# Patient Record
Sex: Female | Born: 1980 | Race: Black or African American | Marital: Single
Health system: Southern US, Community
[De-identification: ages and names within clinical notes are randomized; demographics above are authoritative.]

## PROBLEM LIST (undated history)

## (undated) DIAGNOSIS — Z9289 Personal history of other medical treatment: Secondary | ICD-10-CM

## (undated) DIAGNOSIS — Z8632 Personal history of gestational diabetes: Secondary | ICD-10-CM

## (undated) DIAGNOSIS — Z86018 Personal history of other benign neoplasm: Secondary | ICD-10-CM

## (undated) DIAGNOSIS — E119 Type 2 diabetes mellitus without complications: Secondary | ICD-10-CM

## (undated) DIAGNOSIS — G43909 Migraine, unspecified, not intractable, without status migrainosus: Secondary | ICD-10-CM

## (undated) DIAGNOSIS — I10 Essential (primary) hypertension: Secondary | ICD-10-CM

## (undated) DIAGNOSIS — K219 Gastro-esophageal reflux disease without esophagitis: Secondary | ICD-10-CM

## (undated) DIAGNOSIS — Z8739 Personal history of other diseases of the musculoskeletal system and connective tissue: Secondary | ICD-10-CM

## (undated) DIAGNOSIS — E7800 Pure hypercholesterolemia, unspecified: Secondary | ICD-10-CM

## (undated) DIAGNOSIS — D649 Anemia, unspecified: Secondary | ICD-10-CM

## (undated) DIAGNOSIS — J302 Other seasonal allergic rhinitis: Secondary | ICD-10-CM

## (undated) DIAGNOSIS — E079 Disorder of thyroid, unspecified: Secondary | ICD-10-CM

## (undated) DIAGNOSIS — M797 Fibromyalgia: Secondary | ICD-10-CM

## (undated) DIAGNOSIS — E039 Hypothyroidism, unspecified: Secondary | ICD-10-CM

## (undated) DIAGNOSIS — K589 Irritable bowel syndrome without diarrhea: Secondary | ICD-10-CM

## (undated) DIAGNOSIS — E559 Vitamin D deficiency, unspecified: Secondary | ICD-10-CM

## (undated) DIAGNOSIS — G43009 Migraine without aura, not intractable, without status migrainosus: Secondary | ICD-10-CM

## (undated) DIAGNOSIS — O1490 Unspecified pre-eclampsia, unspecified trimester: Secondary | ICD-10-CM

## (undated) DIAGNOSIS — Z309 Encounter for contraceptive management, unspecified: Secondary | ICD-10-CM

## (undated) DIAGNOSIS — K279 Peptic ulcer, site unspecified, unspecified as acute or chronic, without hemorrhage or perforation: Secondary | ICD-10-CM

## (undated) HISTORY — DX: Personal history of other medical treatment: Z92.89

## (undated) HISTORY — DX: Personal history of gestational diabetes: Z86.32

## (undated) HISTORY — DX: Personal history of other diseases of the musculoskeletal system and connective tissue: Z87.39

## (undated) HISTORY — DX: Other seasonal allergic rhinitis: J30.2

## (undated) HISTORY — PX: UTERINE FIBROID EMBOLIZATION: SHX825

## (undated) HISTORY — DX: Unspecified pre-eclampsia, unspecified trimester: O14.90

## (undated) HISTORY — PX: BREAST SURGERY: SHX581

## (undated) HISTORY — PX: ROBOTIC ASSISTED LAPAROSCOPIC VAGINAL HYSTERECTOMY WITH FIBROID REMOVAL: SHX5387

## (undated) HISTORY — PX: HERNIA REPAIR: SHX51

## (undated) HISTORY — DX: Migraine without aura, not intractable, without status migrainosus: G43.009

## (undated) HISTORY — DX: Essential (primary) hypertension: I10

## (undated) HISTORY — DX: Irritable bowel syndrome, unspecified: K58.9

## (undated) HISTORY — DX: Encounter for contraceptive management, unspecified: Z30.9

## (undated) HISTORY — PX: ABDOMINAL SURGERY: SHX537

## (undated) HISTORY — DX: Gastro-esophageal reflux disease without esophagitis: K21.9

## (undated) HISTORY — DX: Personal history of other benign neoplasm: Z86.018

## (undated) HISTORY — PX: BREAST REDUCTION SURGERY: SHX8

## (undated) HISTORY — DX: Peptic ulcer, site unspecified, unspecified as acute or chronic, without hemorrhage or perforation: K27.9

## (undated) HISTORY — DX: Type 2 diabetes mellitus without complications: E11.9

## (undated) HISTORY — PX: REPAIR OF PERFORATED ULCER: SHX6065

## (undated) HISTORY — PX: THYROIDECTOMY: SHX17

## (undated) HISTORY — DX: Hypothyroidism, unspecified: E03.9

## (undated) HISTORY — DX: Vitamin D deficiency, unspecified: E55.9

---

## 2014-06-05 LAB — HEMOGLOBIN A1C: Hgb A1c MFr Bld: 5.5 % (ref 4.0–6.0)

## 2014-06-05 LAB — TSH: TSH: 0.03 u[IU]/mL — AB (ref 0.41–5.90)

## 2014-12-10 LAB — BASIC METABOLIC PANEL
BUN: 8 mg/dL (ref 4–21)
Glucose: 1 mg/dL
POTASSIUM: 3.6 mmol/L (ref 3.4–5.3)
SODIUM: 142 mmol/L (ref 137–147)

## 2014-12-10 LAB — HEPATIC FUNCTION PANEL
ALT: 12 U/L (ref 7–35)
AST: 12 U/L — AB (ref 13–35)
Alkaline Phosphatase: 68 U/L (ref 25–125)
Bilirubin, Total: 0.3 mg/dL

## 2014-12-10 LAB — CBC AND DIFFERENTIAL
HEMATOCRIT: 34 % — AB (ref 36–46)
Hemoglobin: 11 g/dL — AB (ref 12.0–16.0)
Platelets: 324 10*3/uL (ref 150–399)
WBC: 5.1 10^3/mL

## 2014-12-11 LAB — LIPID PANEL
CHOLESTEROL: 118 mg/dL (ref 0–200)
HDL: 44 mg/dL (ref 35–70)
LDL Cholesterol: 65 mg/dL
TRIGLYCERIDES: 44 mg/dL (ref 40–160)

## 2014-12-11 LAB — TSH: TSH: 1.76 u[IU]/mL (ref 0.41–5.90)

## 2014-12-30 ENCOUNTER — Ambulatory Visit (INDEPENDENT_AMBULATORY_CARE_PROVIDER_SITE_OTHER): Payer: 59 | Admitting: Osteopathic Medicine

## 2014-12-30 ENCOUNTER — Encounter: Payer: Self-pay | Admitting: Osteopathic Medicine

## 2014-12-30 VITALS — BP 133/94 | HR 85 | Ht 63.0 in

## 2014-12-30 DIAGNOSIS — K219 Gastro-esophageal reflux disease without esophagitis: Secondary | ICD-10-CM | POA: Diagnosis not present

## 2014-12-30 DIAGNOSIS — G43009 Migraine without aura, not intractable, without status migrainosus: Secondary | ICD-10-CM

## 2014-12-30 DIAGNOSIS — E559 Vitamin D deficiency, unspecified: Secondary | ICD-10-CM | POA: Insufficient documentation

## 2014-12-30 DIAGNOSIS — O1490 Unspecified pre-eclampsia, unspecified trimester: Secondary | ICD-10-CM

## 2014-12-30 DIAGNOSIS — E039 Hypothyroidism, unspecified: Secondary | ICD-10-CM | POA: Diagnosis not present

## 2014-12-30 DIAGNOSIS — K589 Irritable bowel syndrome without diarrhea: Secondary | ICD-10-CM | POA: Insufficient documentation

## 2014-12-30 DIAGNOSIS — Z8632 Personal history of gestational diabetes: Secondary | ICD-10-CM

## 2014-12-30 DIAGNOSIS — R002 Palpitations: Secondary | ICD-10-CM

## 2014-12-30 DIAGNOSIS — Z86018 Personal history of other benign neoplasm: Secondary | ICD-10-CM

## 2014-12-30 DIAGNOSIS — I10 Essential (primary) hypertension: Secondary | ICD-10-CM

## 2014-12-30 DIAGNOSIS — J302 Other seasonal allergic rhinitis: Secondary | ICD-10-CM

## 2014-12-30 DIAGNOSIS — Z8739 Personal history of other diseases of the musculoskeletal system and connective tissue: Secondary | ICD-10-CM | POA: Insufficient documentation

## 2014-12-30 DIAGNOSIS — G894 Chronic pain syndrome: Secondary | ICD-10-CM

## 2014-12-30 DIAGNOSIS — K30 Functional dyspepsia: Secondary | ICD-10-CM | POA: Insufficient documentation

## 2014-12-30 DIAGNOSIS — Z309 Encounter for contraceptive management, unspecified: Secondary | ICD-10-CM

## 2014-12-30 DIAGNOSIS — Z9889 Other specified postprocedural states: Secondary | ICD-10-CM | POA: Insufficient documentation

## 2014-12-30 HISTORY — DX: Unspecified pre-eclampsia, unspecified trimester: O14.90

## 2014-12-30 HISTORY — DX: Hypothyroidism, unspecified: E03.9

## 2014-12-30 HISTORY — DX: Migraine without aura, not intractable, without status migrainosus: G43.009

## 2014-12-30 HISTORY — DX: Gastro-esophageal reflux disease without esophagitis: K21.9

## 2014-12-30 HISTORY — DX: Personal history of other diseases of the musculoskeletal system and connective tissue: Z87.39

## 2014-12-30 HISTORY — DX: Essential (primary) hypertension: I10

## 2014-12-30 HISTORY — DX: Personal history of other benign neoplasm: Z86.018

## 2014-12-30 HISTORY — DX: Other seasonal allergic rhinitis: J30.2

## 2014-12-30 HISTORY — DX: Encounter for contraceptive management, unspecified: Z30.9

## 2014-12-30 HISTORY — DX: Vitamin D deficiency, unspecified: E55.9

## 2014-12-30 HISTORY — DX: Personal history of gestational diabetes: Z86.32

## 2014-12-30 MED ORDER — AMITRIPTYLINE HCL 50 MG PO TABS: 50.0000 mg | ORAL_TABLET | Freq: Every day | ORAL | Status: DC

## 2014-12-30 MED ORDER — DOCUSATE SODIUM 50 MG PO CAPS: 50.0000 mg | ORAL_CAPSULE | Freq: Two times a day (BID) | ORAL | Status: AC

## 2014-12-30 MED ORDER — DULOXETINE HCL 30 MG PO CPEP: 30.0000 mg | ORAL_CAPSULE | Freq: Every day | ORAL | Status: DC

## 2014-12-30 NOTE — Progress Notes (Signed)
HPI: Hannah Taylor is a 34 y.o. female who presents to [facility]  today for chief complaint of:  Chief Complaint  Patient presents with  . Establish Care    thyroid, GI, HTN issues   Last seen by doctor about 06/2014. Most concerned today about establishing primary doctor for multiple medical issues.   GI - seen there for IBS, constipation predominant. Colonoscopy done 06/2014 was normal, (+) bright red bloody streaks with constipation BM  ENDOCRINE - hypothyroid concerns, seen recently by Endocrinologist, last TSH normal 11/2014, same dose Thyroid Rx for a year, Hx gestational Dm on no meds  NEURO/Pain - on Duloxetine, isn't really helping anymore. Makes her tired. Been on Zoloft, Baclofen, Naproxen   CARDIAC - Feels SOB sometimes, Propranolol 60 mg daily for palpitations and Lisinopril for blood pressure problems. Has been seen by Cardiologist (can't remmeber name) within the past year, had EKG done but no stress test, no Echo, no Holter   Other medical hx reviewed below.    Past medical, social and family history reviewed: Past Medical History  Diagnosis Date  . Essential hypertension 12/30/2014  . Hypothyroidism 12/30/2014  . Stomach upset 12/30/2014  . History of low back pain 12/30/2014  . Migraine without aura 12/30/2014  . Seasonal allergies 12/30/2014  . Vitamin D deficiency 12/30/2014  . GERD (gastroesophageal reflux disease) 12/30/2014    On Prilosec 20 ER  . History of bilateral breast reduction surgery 12/30/2014  . Contraception management 12/30/2014    Depo Provera  . History of uterine fibroid 12/30/2014    S/p removal  . History of gestational diabetes 12/30/2014  . Preeclampsia 12/30/2014    History of    No past surgical history on file. Social History  Substance Use Topics  . Smoking status: Not on file  . Smokeless tobacco: Not on file  . Alcohol Use: Not on file   No family history on file.  Current Outpatient  Prescriptions  Medication Sig Dispense Refill  . DULoxetine (CYMBALTA) 60 MG capsule Take 60 mg by mouth daily.    Marland Kitchen levothyroxine (SYNTHROID, LEVOTHROID) 125 MCG tablet Take 125 mcg by mouth daily before breakfast.    . lisinopril-hydrochlorothiazide (PRINZIDE,ZESTORETIC) 20-25 MG tablet Take 1 tablet by mouth daily.    . naproxen (NAPROSYN) 500 MG tablet Take 500 mg by mouth as needed.    . ondansetron (ZOFRAN) 4 MG tablet Take 4 mg by mouth every 8 (eight) hours as needed for nausea or vomiting.    . propranolol (INDERAL) 60 MG tablet Take 60 mg by mouth 3 (three) times daily.     No current facility-administered medications for this visit.   Allergies  Allergen Reactions  . Aspirin Other (See Comments)    Nose bleeds  . Iodine Swelling      Review of Systems: CONSTITUTIONAL:  Yes  fever, no chills, Yes  unintentional weight changes HEAD/EYES/EARS/NOSE/THROAT: No headache, no vision change, no hearing change, No  sore throat CARDIAC: (+) chest pain, (+) pressure/palpitations, no orthopnea RESPIRATORY: Yes  cough, Yes  shortness of breath/wheeze GASTROINTESTINAL: (+) nausea, no vomiting, no abdominal pain, (+) blood in stool bright red streaks, no black/tarry stool,  no diarrhea, (+) constipation MUSCULOSKELETAL: Yes  myalgia/arthralgia GENITOURINARY: No incontinence, No abnormal genital bleeding/discharge SKIN: No rash/wounds/concerning lesions HEM/ONC: No easy bruising/bleeding, no abnormal lymph node ENDOCRINE: No polyuria/polydipsia/polyphagia, no heat/cold intolerance  NEUROLOGIC: No weakness, no dizziness, no slurred speech, (+) headache, (+) memory problem  PSYCHIATRIC: (+) concerns  with depression, (+) concerns with anxiety, (+) sleep problems "due to physical issues" PHQ2(+)   Exam:  BP 133/94 mmHg  Pulse 85  Ht  (1.6 m) Constitutional: VSS, see above. General Appearance: alert, well-developed, well-nourished, NAD Eyes: Normal lids and conjunctive, non-icteric  sclera, PERRLA Ears, Nose, Mouth, Throat: Normal external inspection ears/nares/mouth/lips/gums, TM normal bilaterally, MMM, posterior pharynx No  erythema No  exudate Neck: No masses, trachea midline. No thyroid enlargement/tenderness/mass appreciated. No lymphadenopathy Respiratory: Normal respiratory effort. no wheeze, no rhonchi, no rales Cardiovascular: S1/S2 normal, no murmur, no rub/gallop auscultated. RRR.  No carotid bruit or JVD. No abdominal aortic bruit.  Pedal pulse II/IV bilaterally DP and PT.  No lower extremity edema. Gastrointestinal: Nontender, no masses. No hepatomegaly, no splenomegaly. No hernia appreciated. Bowel sounds normal. Rectal exam deferred.  Musculoskeletal: Gait normal. No clubbing/cyanosis of digits.  Neurological: No cranial nerve deficit on limited exam. Motor and sensation intact and symmetric Psychiatric: Normal judgment/insight. Normal mood and affect. Oriented x3.    No results found for this or any previous visit (from the past 72 hour(s)).   EKG interpretation: Rate: 75  Rhythm: sinus No ST/T changes concerning for acute ischemia/infarct  Invert/flat T III, V1, V3, V4     ASSESSMENT/PLAN:  Essential hypertension , controlled on current medications, continue these.  Hypothyroidism, unspecified hypothyroidism type most recent TSH normal, continue current dose levothyroxine  Gastroesophageal reflux disease, esophagitis presence not specified avoid NSAID such as naproxen, may consider restart ranitidine, patient not complaining of GERD symptoms today, await records,   History of gestational diabetes- most recent A1c normal   Preeclampsia, unspecified trimester - history of  Palpitations - relatively controlled on Propranolol but reports some increased SOB lately, referral to cardiology placed, pt has no hx w/u other than EKG - Plan: EKG 12-Lead, Ambulatory referral to Cardiology  Chronic pain disorder - taper of dulxetine, strat  amitriptyline, see pt instructions - Plan: DULoxetine (CYMBALTA) 30 MG capsule, amitriptyline (ELAVIL) 50 MG tablet  Irritable bowel syndrome (IBS) - Plan: docusate sodium (COLACE) 50 MG capsule   Below was printed for and reviewed with the pt.    CHRONIC PAIN: 1. TAPER OFF DULOXETINE: STOP 60 MG CAPSULES. WILL TAKE 30 MG CAPSULES X 2 WEEKS THEN STOP ALTOGETHER 2. WILL START AMITRIPTYLINE: TAKE 25 MG TABLETS (HALF OF 50 MG TABLET) AT NIGHT X 2 WEEKS, THEN INCREASE TO 50 MG (WHOLE TABLET) AT NIGHT  HEART: WE ARE REFERRING TO CARDIOLOGY TO ENSURE NO ABNORMALITY WHICH MAY BE CAUSING YOUR PALPITATIONS AND BREATHING TROUBLES. THEY MAY DECIDE TO DO FURTHER TESTING. ANY CONCERNING CHEST PAIN OR DIFFICULTY BREATHING, YOU NEED TO GO TO THE EMERGENCY ROOM!   STOMACH/GI ISSUES: CONTINUE ZOFRAN FOR NAUSEA AS NEEDED. AMITRIPTYLINE MAY HELP SYMPTOMS. WE ARE ALSO STARTING COLACE STOOL SOFTENER WHICH SHOULD HELP PREVENT CONSTIPATION. WE WILL TALK MORE ABOUT THIS DIAGNOSIS AND TREATMENT OPTIONS AT NEXT VISIT.    Patient has been educated on significant possible side effects of medication and is instructed to contact me or other medical professional with any concerns about side effects.  ER precautions reviewed.  RTC sooner if any concerns.   Return in about 4 weeks (around 01/27/2015), or sooner if symptoms worsen or fail to improve, for GI AND HEART ISSUES., Will also follow up on psych concerns

## 2014-12-30 NOTE — Patient Instructions (Addendum)
CHRONIC PAIN: 1. TAPER OFF DULOXETINE: STOP 60 MG CAPSULES. WILL TAKE 30 MG CAPSULES X 2 WEEKS THEN STOP ALTOGETHER 2. WILL START AMITRIPTYLINE: TAKE 25 MG TABLETS (HALF OF 50 MG TABLET) AT NIGHT X 2 WEEKS, THEN INCREASE TO 50 MG (WHOLE TABLET) AT NIGHT  HEART: WE ARE REFERRING TO CARDIOLOGY TO ENSURE NO ABNORMALITY WHICH MAY BE CAUSING YOUR PALPITATIONS AND BREATHING TROUBLES. THEY MAY DECIDE TO DO FURTHER TESTING. ANY CONCERNING CHEST PAIN OR DIFFICULTY BREATHING, YOU NEED TO GO TO THE EMERGENCY ROOM!   STOMACH/GI ISSUES: CONTINUE ZOFRAN FOR NAUSEA AS NEEDED. AMITRIPTYLINE MAY HELP SYMPTOMS. WE ARE ALSO STARTING COLACE STOOL SOFTENER WHICH SHOULD HELP PREVENT CONSTIPATION. WE WILL TALK MORE ABOUT THIS DIAGNOSIS AND TREATMENT OPTIONS AT NEXT VISIT.

## 2015-01-01 ENCOUNTER — Encounter: Payer: Self-pay | Admitting: Emergency Medicine

## 2015-01-01 LAB — CHG IRON BINDING TEST
Albumin, Serum: 4.2
CHLORIDE: 103 mmol/L
GFR CALC AF AMER: 113
IRON: 78
MCV: 92
TIBC: 294

## 2015-01-01 LAB — VITAMIN D 1,25 DIHYDROXY
Amylase, Serum: 53
LIPASE: 31
VIT D 25 HYDROXY: 27.6

## 2015-01-01 LAB — T4: T4,Free (Direct): 1.62

## 2015-01-07 ENCOUNTER — Encounter: Payer: Self-pay | Admitting: Osteopathic Medicine

## 2015-01-07 DIAGNOSIS — Z8739 Personal history of other diseases of the musculoskeletal system and connective tissue: Secondary | ICD-10-CM | POA: Insufficient documentation

## 2015-01-07 DIAGNOSIS — Z87898 Personal history of other specified conditions: Secondary | ICD-10-CM | POA: Insufficient documentation

## 2015-01-07 DIAGNOSIS — Z9289 Personal history of other medical treatment: Secondary | ICD-10-CM

## 2015-01-07 HISTORY — DX: Personal history of other medical treatment: Z92.89

## 2015-01-29 ENCOUNTER — Ambulatory Visit (INDEPENDENT_AMBULATORY_CARE_PROVIDER_SITE_OTHER): Payer: 59 | Admitting: Osteopathic Medicine

## 2015-01-29 DIAGNOSIS — I10 Essential (primary) hypertension: Secondary | ICD-10-CM

## 2015-01-29 NOTE — Progress Notes (Signed)
No show

## 2015-02-02 NOTE — Progress Notes (Signed)
HPI: 34 year old female for evaluation of dyspnea. TSH and potassium normal October 2016. Patient complains of dyspnea for 6 months to one year. It occurs both with exertion and at rest. No orthopnea, PND. Occasional minimal pedal edema. She occasionally feels episodes of tightness extremities her left or right upper extremity associated with chest tightness. It can last an hour at a time. Not pleuritic or positional and not exertional. She does not have exertional chest pain. She does have occasional palpitations but they have improved with the addition of propranolol.  Current Outpatient Prescriptions  Medication Sig Dispense Refill  . amitriptyline (ELAVIL) 50 MG tablet Take 1 tablet (50 mg total) by mouth at bedtime. 30 tablet 1  . docusate sodium (COLACE) 50 MG capsule Take 1 capsule (50 mg total) by mouth 2 (two) times daily. 10 capsule 0  . DULoxetine (CYMBALTA) 30 MG capsule Take 1 capsule (30 mg total) by mouth daily. 14 capsule 0  . levothyroxine (SYNTHROID, LEVOTHROID) 125 MCG tablet Take 125 mcg by mouth daily before breakfast.    . lisinopril-hydrochlorothiazide (PRINZIDE,ZESTORETIC) 20-25 MG tablet Take 1 tablet by mouth daily.    . medroxyPROGESTERone (DEPO-PROVERA) 150 MG/ML injection Inject 150 mg into the muscle every 3 (three) months.    . naproxen (NAPROSYN) 500 MG tablet Take 500 mg by mouth as needed.    . ondansetron (ZOFRAN) 4 MG tablet Take 4 mg by mouth every 8 (eight) hours as needed for nausea or vomiting.    . propranolol (INDERAL) 60 MG tablet Take 60 mg by mouth 3 (three) times daily.     No current facility-administered medications for this visit.    Allergies  Allergen Reactions  . Aspirin Other (See Comments)    Nose bleeds  . Iodinated Diagnostic Agents Swelling  . Iodine Swelling     Past Medical History  Diagnosis Date  . Essential hypertension 12/30/2014  . Hypothyroidism 12/30/2014  . History of low back pain 12/30/2014  . Migraine without  aura 12/30/2014  . Seasonal allergies 12/30/2014  . Vitamin D deficiency 12/30/2014  . GERD (gastroesophageal reflux disease) 12/30/2014    On Prilosec 20 ER  . Contraception management 12/30/2014    Depo Provera  . History of uterine fibroid 12/30/2014    S/p removal  . History of gestational diabetes 12/30/2014  . Preeclampsia 12/30/2014    History of   . Irritable bowel syndrome (IBS)   . H/O mammogram 01/07/2015    Normal 09/18/10  . PUD (peptic ulcer disease)     Past Surgical History  Procedure Laterality Date  . Breast reduction surgery    . Hernia repair    . Repair of perforated ulcer    . Cesarean section    . Robotic assisted laparoscopic vaginal hysterectomy with fibroid removal      Social History   Social History  . Marital Status: Single    Spouse Name: N/A  . Number of Children: 1  . Years of Education: N/A   Occupational History  . Not on file.   Social History Main Topics  . Smoking status: Never Smoker   . Smokeless tobacco: Not on file  . Alcohol Use: 0.0 oz/week    0 Standard drinks or equivalent per week     Comment: Occasional  . Drug Use: Not on file  . Sexual Activity: Not on file   Other Topics Concern  . Not on file   Social History Narrative    Family History  Problem Relation Age of Onset  . Diabetes Mother   . Hypertension Mother   . Hyperlipidemia Father   . Hypertension Father   . Diabetes Maternal Grandmother   . Kidney failure Maternal Grandmother     ROS: Fatigue and weight gain but no fevers or chills, productive cough, hemoptysis, dysphasia, odynophagia, melena, hematochezia, dysuria, hematuria, rash, seizure activity, orthopnea, PND, pedal edema, claudication. Remaining systems are negative.  Physical Exam:   Blood pressure 134/90, pulse 97, height 5\' 3"  (1.6 m), weight 183 lb 6.4 oz (83.19 kg), SpO2 98 %.  General:  Well developed/well nourished in NAD Skin warm/dry Patient not depressed No peripheral  clubbing Back-normal HEENT-normal/normal eyelids Neck supple/normal carotid upstroke bilaterally; no bruits; no JVD; no thyromegaly chest - CTA/ normal expansion CV - RRR/normal S1 and S2; no murmurs, rubs or gallops;  PMI nondisplaced Abdomen -NT/ND, no HSM, no mass, + bowel sounds, no bruit 2+ femoral pulses, no bruits Ext-no edema, chords, 2+ DP Neuro-grossly nonfocal  ECG 12/30/2014-sinus rhythm with Nonspecific ST-T changes.

## 2015-02-04 ENCOUNTER — Encounter: Payer: Self-pay | Admitting: Cardiology

## 2015-02-04 ENCOUNTER — Ambulatory Visit (INDEPENDENT_AMBULATORY_CARE_PROVIDER_SITE_OTHER): Payer: 59 | Admitting: Cardiology

## 2015-02-04 VITALS — BP 134/90 | HR 97 | Ht 63.0 in | Wt 183.4 lb

## 2015-02-04 DIAGNOSIS — R0600 Dyspnea, unspecified: Secondary | ICD-10-CM | POA: Insufficient documentation

## 2015-02-04 DIAGNOSIS — R002 Palpitations: Secondary | ICD-10-CM | POA: Diagnosis not present

## 2015-02-04 DIAGNOSIS — Z87898 Personal history of other specified conditions: Secondary | ICD-10-CM | POA: Diagnosis not present

## 2015-02-04 DIAGNOSIS — I10 Essential (primary) hypertension: Secondary | ICD-10-CM | POA: Diagnosis not present

## 2015-02-04 NOTE — Assessment & Plan Note (Signed)
Patient symptoms do not sound to be cardiac. We will schedule an echocardiogram to assess LV function and wall motion.

## 2015-02-04 NOTE — Assessment & Plan Note (Signed)
Etiology unclear.We will arrange an echocardiogram to assess LV function. If normal I do not think we need to pursue further cardiac evaluation.

## 2015-02-04 NOTE — Assessment & Plan Note (Addendum)
We will advance propranolol once we know whether she is taking long-acting or regular.

## 2015-02-04 NOTE — Assessment & Plan Note (Signed)
Blood pressure is borderline. She is not clear if she is taking propranolol LA or propranolol regular formulation. She will contact us and we will increase this medication both for history of palpitations and blood pressure.

## 2015-02-04 NOTE — Patient Instructions (Signed)
Medication Instructions:  Please continue your current medications.  Please call tomorrow to speak with a nurse regarding your Propranolol (whether it is short or long acting)  Labwork: NONE  Testing/Procedures: 1. 2D Echocardiogram - Your physician has requested that you have an echocardiogram. Echocardiography is a painless test that uses sound waves to create images of your heart. It provides your doctor with information about the size and shape of your heart and how well your heart's chambers and valves are working. This procedure takes approximately one hour. There are no restrictions for this procedure.  Follow-Up: Dr Jens Som recommends that you schedule a follow-up appointment in 6 months. You will receive a reminder letter in the mail two months in advance. If you don't receive a letter, please call our office to schedule the follow-up appointment.  If you need a refill on your cardiac medications before your next appointment, please call your pharmacy.

## 2015-02-05 ENCOUNTER — Encounter: Payer: Self-pay | Admitting: Osteopathic Medicine

## 2015-02-05 ENCOUNTER — Telehealth: Payer: Self-pay | Admitting: Cardiology

## 2015-02-05 ENCOUNTER — Ambulatory Visit (INDEPENDENT_AMBULATORY_CARE_PROVIDER_SITE_OTHER): Payer: 59 | Admitting: Osteopathic Medicine

## 2015-02-05 VITALS — BP 128/76 | HR 94 | Ht 63.0 in | Wt 182.0 lb

## 2015-02-05 DIAGNOSIS — G894 Chronic pain syndrome: Secondary | ICD-10-CM

## 2015-02-05 DIAGNOSIS — L509 Urticaria, unspecified: Secondary | ICD-10-CM | POA: Insufficient documentation

## 2015-02-05 DIAGNOSIS — R110 Nausea: Secondary | ICD-10-CM | POA: Diagnosis not present

## 2015-02-05 DIAGNOSIS — R52 Pain, unspecified: Secondary | ICD-10-CM | POA: Diagnosis not present

## 2015-02-05 LAB — POCT INFLUENZA A/B
INFLUENZA B, POC: NEGATIVE
Influenza A, POC: NEGATIVE

## 2015-02-05 MED ORDER — PREGABALIN 75 MG PO CAPS: 75.0000 mg | ORAL_CAPSULE | Freq: Two times a day (BID) | ORAL | Status: DC

## 2015-02-05 NOTE — Telephone Encounter (Signed)
Calling to speak w/ a Nurse about her medications . Please call   Thanks

## 2015-02-05 NOTE — Progress Notes (Signed)
HPI: Hannah Taylor is a 34 y.o. female who presents to [facility] today for chief complaint of:  Chief Complaint  Patient presents with  . Allergic Reaction    AMITRIPTYLINE   . Location: on chest, in fold of arms  . Quality: itching . Severity: mild  . Context: instructions given at last visit 12/30/14 to taper Cymbalta and start Amitriptyline. She started the Amitriptyline about 2 weeks after last visit. Started itching soon after that, last night the itching was pretty bad and developed a rash, took benadryl. Other than the itching, it has been helping chronic pain and IBS symptoms.  . Modifying factors: Amitriptyline has been helping w/ pain and IBS symptoms.  . Assoc signs/symptoms: Pt also complaining of nausea, subjective fever, chills, loose stool x 2 and wants checked for the flu. No rash now.    Past medical, social and family history reviewed: Past Medical History  Diagnosis Date  . Essential hypertension 12/30/2014  . Hypothyroidism 12/30/2014  . History of low back pain 12/30/2014  . Migraine without aura 12/30/2014  . Seasonal allergies 12/30/2014  . Vitamin D deficiency 12/30/2014  . GERD (gastroesophageal reflux disease) 12/30/2014    On Prilosec 20 ER  . Contraception management 12/30/2014    Depo Provera  . History of uterine fibroid 12/30/2014    S/p removal  . History of gestational diabetes 12/30/2014  . Preeclampsia 12/30/2014    History of   . Irritable bowel syndrome (IBS)   . H/O mammogram 01/07/2015    Normal 09/18/10  . PUD (peptic ulcer disease)    Past Surgical History  Procedure Laterality Date  . Breast reduction surgery    . Hernia repair    . Repair of perforated ulcer    . Cesarean section    . Robotic assisted laparoscopic vaginal hysterectomy with fibroid removal     Social History  Substance Use Topics  . Smoking status: Never Smoker   . Smokeless tobacco: Not on file  . Alcohol Use: 0.0 oz/week    0  Standard drinks or equivalent per week     Comment: Occasional   Family History  Problem Relation Age of Onset  . Diabetes Mother   . Hypertension Mother   . Hyperlipidemia Father   . Hypertension Father   . Diabetes Maternal Grandmother   . Kidney failure Maternal Grandmother     Current Outpatient Prescriptions  Medication Sig Dispense Refill  . amitriptyline (ELAVIL) 50 MG tablet Take 1 tablet (50 mg total) by mouth at bedtime. 30 tablet 1  . docusate sodium (COLACE) 50 MG capsule Take 1 capsule (50 mg total) by mouth 2 (two) times daily. 10 capsule 0  . levothyroxine (SYNTHROID, LEVOTHROID) 125 MCG tablet Take 125 mcg by mouth daily before breakfast.    . lisinopril-hydrochlorothiazide (PRINZIDE,ZESTORETIC) 20-25 MG tablet Take 1 tablet by mouth daily.    . medroxyPROGESTERone (DEPO-PROVERA) 150 MG/ML injection Inject 150 mg into the muscle every 3 (three) months.    . naproxen (NAPROSYN) 500 MG tablet Take 500 mg by mouth as needed.    . ondansetron (ZOFRAN) 4 MG tablet Take 4 mg by mouth every 8 (eight) hours as needed for nausea or vomiting.    . propranolol (INDERAL) 60 MG tablet Take 60 mg by mouth 3 (three) times daily.    . DULoxetine (CYMBALTA) 30 MG capsule Take 1 capsule (30 mg total) by mouth daily. (Patient not taking: Reported on 02/05/2015) 14 capsule 0  No current facility-administered medications for this visit.   Allergies  Allergen Reactions  . Aspirin Other (See Comments)    Nose bleeds  . Iodinated Diagnostic Agents Swelling  . Iodine Swelling      Review of Systems: CONSTITUTIONAL:  Subjective  fever, no chills, No  unintentional weight changes HEAD/EYES/EARS/NOSE/THROAT: No  headache, no vision change, no hearing change, No  sore throat, No  sinus pressure CARDIAC: No  chest pain, No  pressure, No palpitations, No  orthopnea RESPIRATORY: No  cough, No  shortness of breath/wheeze GASTROINTESTINAL: Yes  nausea, No  vomiting, No  abdominal pain, No   blood in stool, Occasional  diarrhea, No  constipation  MUSCULOSKELETAL: Yes  myalgia/arthralgia SKIN: No  rash/wounds/concerning lesions now but (+) ithcing as per HPI HEM/ONC: No  easy bruising/bleeding, No  abnormal lymph node PSYCHIATRIC: No  concerns with depression, No  concerns with anxiety, No sleep problems     Exam:  BP 128/76 mmHg  Pulse 94  Ht 5\' 3"  (1.6 m)  Wt 182 lb (82.555 kg)  BMI 32.25 kg/m2 Constitutional: VS see above. General Appearance: alert, well-developed, well-nourished, NAD Eyes: Normal lids and conjunctive, non-icteric sclera, Respiratory: Normal respiratory effort. no wheeze, no rhonchi, no rales Cardiovascular: S1/S2 normal, no murmur, no rub/gallop auscultated. RRR.  Musculoskeletal: Gait normal. No clubbing/cyanosis of digits.  Neurological: No cranial nerve deficit on limited exam. Motor and sensation intact and symmetric Skin: warm, dry, intact. No rash/ulcer. No concerning nevi or subq nodules on limited exam.   Psychiatric: Normal judgment/insight. Slightly flat mood and affect. Oriented x3.    Results for orders placed or performed in visit on 02/05/15 (from the past 24 hour(s))  POCT Influenza A/B     Status: None   Collection Time: 02/05/15  2:09 PM  Result Value Ref Range   Influenza A, POC Negative Negative   Influenza B, POC Negative Negative     ASSESSMENT/PLAN:  Urticaria - question drug reaction Amitriptyline, cont Benadryl prn and follow up in office if no better - Plan: POCT Influenza A/B, skin exam normal today  Chronic pain syndrome - Has been on Cymbalta, Zoloft, Amitryptiline without benefit - Plan: pregabalin (LYRICA) 75 MG capsule, pt advised may need to go through prior auth process, will consider Gabapentin if unable to get Lyrica covered. Pt denies depression problems or previous treatment for depression but worth further exploring this at next visit  Nausea without vomiting - flu neg  Body aches - flu neg  Some  consideration for viral exanthem rather than drug reaction, will try off Amitriptyline and see if improves   Return in about 6 months (around 08/06/2015), or sooner if symptoms worsen or fail to improve, for followup blood pressure, annual physical.

## 2015-02-05 NOTE — Patient Instructions (Addendum)
Stop Amitriptyline. Will order Lyrica, this may take a few days for insurance to approve. If insurance doesn't approve this will try alternative medication. Continue Benadryl to help with itching.

## 2015-02-05 NOTE — Telephone Encounter (Signed)
Left message for patient to return call.

## 2015-02-09 NOTE — Telephone Encounter (Signed)
Left 2nd message for patient to call

## 2015-02-12 ENCOUNTER — Telehealth: Payer: Self-pay | Admitting: Osteopathic Medicine

## 2015-02-12 NOTE — Telephone Encounter (Signed)
Received fax for prior authorization on Lyrica sent through cover my meds waiting on authorization. - CF °

## 2015-02-19 ENCOUNTER — Ambulatory Visit (HOSPITAL_COMMUNITY): Payer: 59 | Attending: Cardiovascular Disease

## 2015-02-20 ENCOUNTER — Encounter (HOSPITAL_COMMUNITY): Payer: Self-pay | Admitting: *Deleted

## 2015-02-24 ENCOUNTER — Telehealth: Payer: Self-pay

## 2015-02-24 MED ORDER — MEDROXYPROGESTERONE ACETATE 150 MG/ML IM SUSP: 150.0000 mg | INTRAMUSCULAR | Status: DC

## 2015-02-24 NOTE — Telephone Encounter (Signed)
Sent!

## 2015-02-24 NOTE — Telephone Encounter (Signed)
Patient needs a refill on Depo-provera sent to [facility]. Historical medication.

## 2015-02-25 NOTE — Telephone Encounter (Signed)
PATIENT HAS BEEN INFORMED. Jarin Cornfield,CMA  

## 2015-03-03 NOTE — Telephone Encounter (Signed)
Received fax from [place] [employer] and they approved Lyrica until 02/11/2017 or coverage for the medication is no longer available. Reference [id number]. - CF

## 2015-04-07 ENCOUNTER — Telehealth: Payer: Self-pay

## 2015-04-07 MED ORDER — LISINOPRIL-HYDROCHLOROTHIAZIDE 20-25 MG PO TABS: 1.0000 | ORAL_TABLET | Freq: Every day | ORAL | Status: DC

## 2015-04-07 NOTE — Telephone Encounter (Signed)
Patient request refill for Lisinopril # 30 0 Refills were sent to [facility]. Patient was advised that she needed a follow up appointment before her medication ran out and patient was transferred to scheduling to make appointment. Rhonda Cunningham,CMA

## 2015-04-16 ENCOUNTER — Other Ambulatory Visit (HOSPITAL_COMMUNITY): Payer: 59

## 2015-04-24 ENCOUNTER — Ambulatory Visit (INDEPENDENT_AMBULATORY_CARE_PROVIDER_SITE_OTHER): Payer: 59 | Admitting: Osteopathic Medicine

## 2015-04-24 DIAGNOSIS — Z5329 Procedure and treatment not carried out because of patient's decision for other reasons: Secondary | ICD-10-CM

## 2015-04-24 NOTE — Progress Notes (Signed)
NO SHOW

## 2015-04-30 ENCOUNTER — Other Ambulatory Visit (HOSPITAL_COMMUNITY): Payer: 59

## 2015-05-14 ENCOUNTER — Ambulatory Visit (HOSPITAL_COMMUNITY): Payer: BLUE CROSS/BLUE SHIELD | Attending: Cardiology

## 2015-05-14 ENCOUNTER — Other Ambulatory Visit: Payer: Self-pay

## 2015-05-14 DIAGNOSIS — I10 Essential (primary) hypertension: Secondary | ICD-10-CM | POA: Insufficient documentation

## 2015-05-14 DIAGNOSIS — R0600 Dyspnea, unspecified: Secondary | ICD-10-CM | POA: Diagnosis not present

## 2015-05-19 ENCOUNTER — Ambulatory Visit (INDEPENDENT_AMBULATORY_CARE_PROVIDER_SITE_OTHER): Payer: BLUE CROSS/BLUE SHIELD | Admitting: Osteopathic Medicine

## 2015-05-19 ENCOUNTER — Encounter: Payer: Self-pay | Admitting: Osteopathic Medicine

## 2015-05-19 VITALS — BP 107/67 | HR 84 | Ht 63.0 in | Wt 186.0 lb

## 2015-05-19 DIAGNOSIS — G8929 Other chronic pain: Secondary | ICD-10-CM | POA: Diagnosis not present

## 2015-05-19 DIAGNOSIS — E669 Obesity, unspecified: Secondary | ICD-10-CM | POA: Diagnosis not present

## 2015-05-19 DIAGNOSIS — E039 Hypothyroidism, unspecified: Secondary | ICD-10-CM

## 2015-05-19 DIAGNOSIS — I10 Essential (primary) hypertension: Secondary | ICD-10-CM

## 2015-05-19 DIAGNOSIS — G4700 Insomnia, unspecified: Secondary | ICD-10-CM

## 2015-05-19 LAB — TSH: TSH: 2.03 mIU/L

## 2015-05-19 MED ORDER — LISINOPRIL-HYDROCHLOROTHIAZIDE 20-25 MG PO TABS: 1.0000 | ORAL_TABLET | Freq: Every day | ORAL | Status: DC

## 2015-05-19 MED ORDER — PROPRANOLOL HCL ER 60 MG PO CP24: 60.0000 mg | ORAL_CAPSULE | Freq: Every day | ORAL | Status: DC

## 2015-05-19 MED ORDER — LIRAGLUTIDE -WEIGHT MANAGEMENT 18 MG/3ML ~~LOC~~ SOPN: 3.0000 mg | PEN_INJECTOR | Freq: Every day | SUBCUTANEOUS | Status: DC

## 2015-05-19 MED ORDER — AMITRIPTYLINE HCL 75 MG PO TABS: 75.0000 mg | ORAL_TABLET | Freq: Every day | ORAL | Status: DC

## 2015-05-19 NOTE — Progress Notes (Signed)
HPI: Hannah Taylor is a 35 y.o. female who presents to [facility] today for chief complaint of:  Chief Complaint  Patient presents with  . Follow-up    BLOOD PREESURE AND ECHOCARDIOGRAM     CHRONIC PAIN - Unable to get Lyrica filled, doing well overall with her symptoms on the amitriptyline however having some insomnia issues.  HYPERTENSION - here today because running low on medications, was told no additional refills until Dr. visit, no chest pain/pressure, no palpitations. Blood pressure well controlled today. See below for vitals.  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 Changed diet, more proteins and vegetables, occasional cheating days. Ongoing over past 5 years.     THYROID - TSH okay about 5 months ago    Past medical, social and family history reviewed: Past Medical History  Diagnosis Date  . Essential hypertension 12/30/2014  . Hypothyroidism 12/30/2014  . History of low back pain 12/30/2014  . Migraine without aura 12/30/2014  . Seasonal allergies 12/30/2014  . Vitamin D deficiency 12/30/2014  . GERD (gastroesophageal reflux disease) 12/30/2014    On Prilosec 20 ER  . Contraception management 12/30/2014    Depo Provera  . History of uterine fibroid 12/30/2014    S/p removal  . History of gestational diabetes 12/30/2014  . Preeclampsia 12/30/2014    History of   . Irritable bowel syndrome (IBS)   . H/O mammogram 01/07/2015    Normal 09/18/10  . PUD (peptic ulcer disease)    Past Surgical History  Procedure Laterality Date  . Breast reduction surgery    . Hernia repair    . Repair of perforated ulcer    . Cesarean section    . Robotic assisted laparoscopic vaginal hysterectomy with fibroid removal     Social History  Substance Use Topics  . Smoking status: Never Smoker   . Smokeless tobacco: Not on file  . Alcohol Use: 0.0 oz/week    0 Standard drinks or equivalent per week    Comment: Occasional   Family History  Problem Relation Age of Onset  . Diabetes Mother   . Hypertension Mother   . Hyperlipidemia Father   . Hypertension Father   . Diabetes Maternal Grandmother   . Kidney failure Maternal Grandmother     Current Outpatient Prescriptions  Medication Sig Dispense Refill  . docusate sodium (COLACE) 50 MG capsule Take 1 capsule (50 mg total) by mouth 2 (two) times daily. 10 capsule 0  . levothyroxine (SYNTHROID, LEVOTHROID) 125 MCG tablet Take 125 mcg by mouth daily before breakfast.    . lisinopril-hydrochlorothiazide (PRINZIDE,ZESTORETIC) 20-25 MG tablet Take 1 tablet by mouth daily. PLEASE KEEP FOLLOW UP APPOINTMENT 30 tablet 0  . medroxyPROGESTERone (DEPO-PROVERA) 150 MG/ML injection Inject 1 mL (150 mg total) into the muscle every 3 (three) months. 1 mL 3  . naproxen (NAPROSYN) 500 MG tablet Take 500 mg by mouth as needed.    . ondansetron (ZOFRAN) 4 MG tablet Take 4 mg by mouth every 8 (eight) hours as needed for nausea or vomiting.    . pregabalin (LYRICA) 75 MG capsule Take 1 capsule (75 mg total) by mouth 2 (two) times daily. 60 capsule 3  . propranolol ER (INDERAL LA) 60 MG 24 hr capsule Take 60 mg by mouth daily.     No current facility-administered medications for this visit.   Allergies  Allergen Reactions  . Aspirin Other (See Comments)    Nose  bleeds  . Iodinated Diagnostic Agents Swelling  . Iodine Swelling      Review of Systems: CONSTITUTIONAL:  No  fever, no chills, No  unintentional weight changes HEAD/EYES/EARS/NOSE/THROAT: No  headache, no vision change, CARDIAC: No  chest pain, No  pressure, No palpitations, No  orthopnea RESPIRATORY: No  cough, No  shortness of breath/wheeze ENDOCRINE: No polyuria/polydipsia/polyphagia, No  heat/cold intolerance  NEUROLOGIC: No  weakness, No  dizziness, No  slurred speech PSYCHIATRIC: No  concerns with depression, No  concerns with anxiety, No sleep problems  Exam:  BP 107/67 mmHg   Pulse 84  Ht  (1.6 m)  Wt 186 lb (84.369 kg)  BMI 32.96 kg/m2 Constitutional: VS see above. General Appearance: alert, well-developed, well-nourished, NAD Eyes: Normal lids and conjunctive, non-icteric sclera,  Ears, Nose, Mouth, Throat: MMM, Normal external inspection ears/nares/mouth/lips/gums,   Neck: No masses, trachea midline Respiratory: Normal respiratory effort. no wheeze, no rhonchi, no rales Cardiovascular: S1/S2 normal, no murmur, no rub/gallop auscultated. RRR. No lower extremity edema. Psychiatric: Normal judgment/insight. Normal mood and affect. Oriented x3.    No results found for this or any previous visit (from the past 72 hour(s)).    ASSESSMENT/PLAN: Continue six-month blood pressure medicines, will increase amitriptyline to hopefully help with insomnia, patient tolerating this without side effects can increase dose further but would like her to call his first. Discussed obesity management, patient opts for saxenda, would hesitate to do phentermine since risk of insomnia/anxiety in patient with known hypertension. Savings card given for Saxenda, let us know if any issues, would consider Contrave or Qsymia. ER/RTC precautions reviewed. See patient instructions printed.   Essential hypertension - Plan: lisinopril-hydrochlorothiazide (PRINZIDE,ZESTORETIC) 20-25 MG tablet, propranolol ER (INDERAL LA) 60 MG 24 hr capsule  Insomnia - Plan: amitriptyline (ELAVIL) 75 MG tablet  Chronic pain - Plan: amitriptyline (ELAVIL) 75 MG tablet  Obesity (BMI 30-39.9) - Plan: Liraglutide -Weight Management (SAXENDA) 18 MG/3ML SOPN  Hypothyroidism, unspecified hypothyroidism type - Plan: TSH   Return in about 6 months (around 11/19/2015) for HTN FOLLOWUP, also monthly for weight check on anti-obesity meds.

## 2015-05-19 NOTE — Patient Instructions (Signed)
I have written for 6 month supply of blood pressure medication, plan to follow-up with office visit in 6 months for medication management.  Checking thyroid labs today, if need to change dose of thyroid medicine, we'll let you know. Otherwise repeat in 6 months.   We've given prescription for sex and to help with weight loss, we can expect to go through a prior office process and also the process to utilize the savings card for this. Please let us know if there are any issues with this medicine. Once you have it filled excessively, please bring pens into the office for a scheduled nurse visit so they can show you how to properly administer the medicine. If you're unable to get this medication filled, please let me know and we can talk about alternative oral medicines. Continue with exercise, portion control.  Increasing amitriptyline as discussed, please let me know if you're doing well on this medicine, if no side effects but still not controlling symptoms, can go up on the dose further but please let me know.

## 2015-05-20 MED ORDER — LEVOTHYROXINE SODIUM 125 MCG PO TABS: 125.0000 ug | ORAL_TABLET | Freq: Every day | ORAL | Status: DC

## 2015-05-20 NOTE — Addendum Note (Signed)
Addended by: Deirdre Pippins on: 05/20/2015 10:14 AM   Modules accepted: Orders

## 2015-05-22 ENCOUNTER — Ambulatory Visit: Payer: BLUE CROSS/BLUE SHIELD | Admitting: Osteopathic Medicine

## 2015-05-22 ENCOUNTER — Telehealth: Payer: Self-pay

## 2015-05-22 NOTE — Telephone Encounter (Signed)
Hannah Taylor called and states she heard from [facility] and they are denying her Moquino Blas due to the fact she has not tried other medications first. She states she has tried phentermine in the past with a different provider.

## 2015-05-25 ENCOUNTER — Other Ambulatory Visit: Payer: Self-pay

## 2015-05-25 ENCOUNTER — Encounter: Payer: Self-pay | Admitting: Osteopathic Medicine

## 2015-05-25 ENCOUNTER — Ambulatory Visit (INDEPENDENT_AMBULATORY_CARE_PROVIDER_SITE_OTHER): Payer: BLUE CROSS/BLUE SHIELD | Admitting: Osteopathic Medicine

## 2015-05-25 VITALS — BP 115/80 | HR 92 | Ht 60.3 in | Wt 187.0 lb

## 2015-05-25 DIAGNOSIS — R7301 Impaired fasting glucose: Secondary | ICD-10-CM

## 2015-05-25 DIAGNOSIS — R7309 Other abnormal glucose: Secondary | ICD-10-CM | POA: Diagnosis not present

## 2015-05-25 DIAGNOSIS — K219 Gastro-esophageal reflux disease without esophagitis: Secondary | ICD-10-CM | POA: Diagnosis not present

## 2015-05-25 LAB — COMPLETE METABOLIC PANEL WITH GFR
ALBUMIN: 4 g/dL (ref 3.6–5.1)
ALK PHOS: 73 U/L (ref 33–115)
ALT: 12 U/L (ref 6–29)
AST: 11 U/L (ref 10–30)
BILIRUBIN TOTAL: 0.3 mg/dL (ref 0.2–1.2)
BUN: 9 mg/dL (ref 7–25)
CO2: 24 mmol/L (ref 20–31)
CREATININE: 0.8 mg/dL (ref 0.50–1.10)
Calcium: 8.9 mg/dL (ref 8.6–10.2)
Chloride: 104 mmol/L (ref 98–110)
GFR, Est African American: >89 mL/min (ref >=60)
GLUCOSE: 235 mg/dL — AB (ref 65–99)
Potassium: 4.1 mmol/L (ref 3.5–5.3)
SODIUM: 137 mmol/L (ref 135–146)
TOTAL PROTEIN: 6.5 g/dL (ref 6.1–8.1)

## 2015-05-25 LAB — CBC WITH DIFFERENTIAL/PLATELET
BASOS ABS: 0 {cells}/uL (ref 0–200)
Basophils Relative: 0 %
EOS PCT: 2 %
Eosinophils Absolute: 122 cells/uL (ref 15–500)
HCT: 34.6 % — ABNORMAL LOW (ref 35.0–45.0)
Hemoglobin: 11.3 g/dL — ABNORMAL LOW (ref 11.7–15.5)
LYMPHS ABS: 2074 {cells}/uL (ref 850–3900)
Lymphocytes Relative: 34 %
MCH: 29.4 pg (ref 27.0–33.0)
MCHC: 32.7 g/dL (ref 32.0–36.0)
MCV: 89.9 fL (ref 80.0–100.0)
MONOS PCT: 5 %
MPV: 9.4 fL (ref 7.5–12.5)
Monocytes Absolute: 305 cells/uL (ref 200–950)
NEUTROS PCT: 59 %
Neutro Abs: 3599 cells/uL (ref 1500–7800)
PLATELETS: 272 10*3/uL (ref 140–400)
RBC: 3.85 MIL/uL (ref 3.80–5.10)
RDW: 12.7 % (ref 11.0–15.0)
WBC: 6.1 10*3/uL (ref 3.8–10.8)

## 2015-05-25 LAB — HEMOGLOBIN A1C
Hgb A1c MFr Bld: 8.4 % — ABNORMAL HIGH (ref <5.7)
Mean Plasma Glucose: 194 mg/dL

## 2015-05-25 LAB — POCT GLYCOSYLATED HEMOGLOBIN (HGB A1C): Hemoglobin A1C: 8.8

## 2015-05-25 MED ORDER — OMEPRAZOLE 20 MG PO CPDR: 20.0000 mg | DELAYED_RELEASE_CAPSULE | Freq: Two times a day (BID) | ORAL | Status: AC

## 2015-05-25 NOTE — Telephone Encounter (Signed)
PA resubmitted for Saxenda

## 2015-05-25 NOTE — Patient Instructions (Addendum)
Let's see how the medicines are working for you in the next 4 - 6 weeks. If your symptoms worsen or change, particularly with regard to pains in the chest or breathing problems, we need to know ASAP, and if you are concerned about heart problems you will need to go to an emergency room for full evaluation. See below for other information on acid reflux, and please let us know if you have any questions.   Your lab tests in the office today indicated a high hemoglobin A1c, this is the lab we used diagnosis and follow diabetes and prediabetes. Since it was high on our point-of-care test, I want to repeat it with the blood draw. We should have the results back by tomorrow or Wednesday and we will call you with these results, please let us know if you don't hear anything. If results do indicate problems with diabetes, we will assess due to come back in for a separate office visit to talk more about this diagnosis and its management.   Please make a separate appointment to discuss weight loss options since Saxenda may not be covered even with the savings card. Some insurance plans do not cover ANY weight loss medications - you can call your insurer and see if they have a list of approved medicines. Alternatives that I use often include Phentermine, Contrave and Qsymia. Do some research on these and see if one of them is something you'd like to try. We can also refer to weight management clinic to discuss other options such as surgery or other programs they have.  Happy birthday! Sorry you had to spend it with [place]! Take care and let us know if there's anything else you need! -Dr. Mervyn Skeeters.    Food Choices for Gastroesophageal Reflux Disease, Adult When you have gastroesophageal reflux disease (GERD), the foods you eat and your eating habits are very important. Choosing the right foods can help ease the discomfort of GERD. WHAT GENERAL GUIDELINES DO I NEED TO FOLLOW?  Choose fruits, vegetables, whole grains, low-fat  dairy products, and low-fat meat, fish, and poultry.  Limit fats such as oils, salad dressings, butter, nuts, and avocado.  Keep a food diary to identify foods that cause symptoms.  Avoid foods that cause reflux. These may be different for different people.  Eat frequent small meals instead of three large meals each day.  Eat your meals slowly, in a relaxed setting.  Limit fried foods.  Cook foods using methods other than frying.  Avoid drinking alcohol.  Avoid drinking large amounts of liquids with your meals.  Avoid bending over or lying down until 2-3 hours after eating. WHAT FOODS ARE NOT RECOMMENDED? The following are some foods and drinks that may worsen your symptoms: Vegetables Tomatoes. Tomato juice. Tomato and spaghetti sauce. Chili peppers. Onion and garlic. Horseradish. Fruits Oranges, grapefruit, and lemon (fruit and juice). Meats High-fat meats, fish, and poultry. This includes hot dogs, ribs, ham, sausage, salami, and bacon. Dairy Whole milk and chocolate milk. Sour cream. Cream. Butter. Ice cream. Cream cheese.  Beverages Coffee and tea, with or without caffeine. Carbonated beverages or energy drinks. Condiments Hot sauce. Barbecue sauce.  Sweets/Desserts Chocolate and cocoa. Donuts. Peppermint and spearmint. Fats and Oils High-fat foods, including [place] fries and potato chips. Other Vinegar. Strong spices, such as black pepper, white pepper, red pepper, cayenne, curry powder, cloves, ginger, and chili powder. The items listed above may not be a complete list of foods and beverages to avoid. Contact your  dietitian for more information.   This information is not intended to replace advice given to you by your health care provider. Make sure you discuss any questions you have with your health care provider.   Document Released: 02/07/2005 Document Revised: 02/28/2014 Document Reviewed: 12/12/2012 Elsevier Interactive Patient Education 2016 Elsevier  Inc[employer]   Gastroesophageal Reflux Disease, Adult Normally, food travels down the esophagus and stays in the stomach to be digested. However, when a person has gastroesophageal reflux disease (GERD), food and stomach acid move back up into the esophagus. When this happens, the esophagus becomes sore and inflamed. Over time, GERD can create small holes (ulcers) in the lining of the esophagus.  CAUSES This condition is caused by a problem with the muscle between the esophagus and the stomach (lower esophageal sphincter, or LES). Normally, the LES muscle closes after food passes through the esophagus to the stomach. When the LES is weakened or abnormal, it does not close properly, and that allows food and stomach acid to go back up into the esophagus. The LES can be weakened by certain dietary substances, medicines, and medical conditions, including:  Tobacco use.  Pregnancy.  Having a hiatal hernia.  Heavy alcohol use.  Certain foods and beverages, such as coffee, chocolate, onions, and peppermint. RISK FACTORS This condition is more likely to develop in:  People who have an increased body weight.  People who have connective tissue disorders.  People who use NSAID medicines. SYMPTOMS Symptoms of this condition include:  Heartburn.  Difficult or painful swallowing.  The feeling of having a lump in the throat.  Abitter taste in the mouth.  Bad breath.  Having a large amount of saliva.  Having an upset or bloated stomach.  Belching.  Chest pain.  Shortness of breath or wheezing.  Ongoing (chronic) cough or a night-time cough.  Wearing away of tooth enamel.  Weight loss. Different conditions can cause chest pain. Make sure to see your health care provider if you experience chest pain. DIAGNOSIS Your health care provider will take a medical history and perform a physical exam. To determine if you have mild or severe GERD, your health care provider may also monitor how you  respond to treatment. You may also have other tests, including:  An endoscopy toexamine your stomach and esophagus with a small camera.  A test thatmeasures the acidity level in your esophagus.  A test thatmeasures how much pressure is on your esophagus.  A barium swallow or modified barium swallow to show the shape, size, and functioning of your esophagus. TREATMENT The goal of treatment is to help relieve your symptoms and to prevent complications. Treatment for this condition may vary depending on how severe your symptoms are. Your health care provider may recommend:  Changes to your diet.  Medicine.  Surgery. HOME CARE INSTRUCTIONS Diet  Follow a diet as recommended by your health care provider. This may involve avoiding foods and drinks such as:  Coffee and tea (with or without caffeine).  Drinks that containalcohol.  Energy drinks and sports drinks.  Carbonated drinks or sodas.  Chocolate and cocoa.  Peppermint and mint flavorings.  Garlic and onions.  Horseradish.  Spicy and acidic foods, including peppers, chili powder, curry powder, vinegar, hot sauces, and barbecue sauce.  Citrus fruit juices and citrus fruits, such as oranges, lemons, and limes.  Tomato-based foods, such as red sauce, chili, salsa, and pizza with red sauce.  Fried and fatty foods, such as donuts, french fries, potato chips, and  high-fat dressings.  High-fat meats, such as hot dogs and fatty cuts of red and white meats, such as rib eye steak, sausage, ham, and bacon.  High-fat dairy items, such as whole milk, butter, and cream cheese.  Eat small, frequent meals instead of large meals.  Avoid drinking large amounts of liquid with your meals.  Avoid eating meals during the 2-3 hours before bedtime.  Avoid lying down right after you eat.  Do not exercise right after you eat. General Instructions  Pay attention to any changes in your symptoms.  Take over-the-counter and  prescription medicines only as told by your health care provider. Do not take aspirin, ibuprofen, or other NSAIDs unless your health care provider told you to do so.  Do not use any tobacco products, including cigarettes, chewing tobacco, and e-cigarettes. If you need help quitting, ask your health care provider.  Wear loose-fitting clothing. Do not wear anything tight around your waist that causes pressure on your abdomen.  Raise (elevate) the head of your bed 6 inches (15cm).  Try to reduce your stress, such as with yoga or meditation. If you need help reducing stress, ask your health care provider.  If you are overweight, reduce your weight to an amount that is healthy for you. Ask your health care provider for guidance about a safe weight loss goal.  Keep all follow-up visits as told by your health care provider. This is important. SEEK MEDICAL CARE IF:  You have new symptoms.  You have unexplained weight loss.  You have difficulty swallowing, or it hurts to swallow.  You have wheezing or a persistent cough.  Your symptoms do not improve with treatment.  You have a hoarse voice. SEEK IMMEDIATE MEDICAL CARE IF:  You have pain in your arms, neck, jaw, teeth, or back.  You feel sweaty, dizzy, or light-headed.  You have chest pain or shortness of breath.  You vomit and your vomit looks like blood or coffee grounds.  You faint.  Your stool is bloody or black.  You cannot swallow, drink, or eat.   This information is not intended to replace advice given to you by your health care provider. Make sure you discuss any questions you have with your health care provider.   Document Released: 11/17/2004 Document Revised: 10/29/2014 Document Reviewed: 06/04/2014 Elsevier Interactive Patient Education [employer]

## 2015-05-25 NOTE — Progress Notes (Signed)
HPI: Hannah Taylor is a 35 y.o. female who presents to [facility] today for chief complaint of:  Chief Complaint  Patient presents with  . Chest Pain    ACID REFLUX, CHEST DISCOMFORT/BURNING . Location: chest, radiating through the back  . Quality: burning . Severity: mild/moderate . Timing: after eating, lying down at night . Context: recent eval in ER for CP . Modifying factors: tums occasionally helps . Assoc signs/symptoms: no SOB, no CP on exertion, no claudication   Past medical, social and family history reviewed: Past Medical History  Diagnosis Date  . Essential hypertension 12/30/2014  . Hypothyroidism 12/30/2014  . History of low back pain 12/30/2014  . Migraine without aura 12/30/2014  . Seasonal allergies 12/30/2014  . Vitamin D deficiency 12/30/2014  . GERD (gastroesophageal reflux disease) 12/30/2014    On Prilosec 20 ER  . Contraception management 12/30/2014    Depo Provera  . History of uterine fibroid 12/30/2014    S/p removal  . History of gestational diabetes 12/30/2014  . Preeclampsia 12/30/2014    History of   . Irritable bowel syndrome (IBS)   . H/O mammogram 01/07/2015    Normal 09/18/10  . PUD (peptic ulcer disease)    Past Surgical History  Procedure Laterality Date  . Breast reduction surgery    . Hernia repair    . Repair of perforated ulcer    . Cesarean section    . Robotic assisted laparoscopic vaginal hysterectomy with fibroid removal     Social History  Substance Use Topics  . Smoking status: Never Smoker   . Smokeless tobacco: Not on file  . Alcohol Use: 0.0 oz/week    0 Standard drinks or equivalent per week     Comment: Occasional   Family History  Problem Relation Age of Onset  . Diabetes Mother   . Hypertension Mother   . Hyperlipidemia Father   . Hypertension Father   . Diabetes Maternal Grandmother   . Kidney failure Maternal Grandmother     Current Outpatient Prescriptions  Medication Sig  Dispense Refill  . amitriptyline (ELAVIL) 75 MG tablet Take 1 tablet (75 mg total) by mouth at bedtime. 30 tablet 0  . docusate sodium (COLACE) 50 MG capsule Take 1 capsule (50 mg total) by mouth 2 (two) times daily. 10 capsule 0  . levothyroxine (SYNTHROID, LEVOTHROID) 125 MCG tablet Take 1 tablet (125 mcg total) by mouth daily before breakfast. 90 tablet 1  . lisinopril-hydrochlorothiazide (PRINZIDE,ZESTORETIC) 20-25 MG tablet Take 1 tablet by mouth daily. PLEASE KEEP FOLLOW UP APPOINTMENT 90 tablet 1  . medroxyPROGESTERone (DEPO-PROVERA) 150 MG/ML injection Inject 1 mL (150 mg total) into the muscle every 3 (three) months. 1 mL 3  . naproxen (NAPROSYN) 500 MG tablet Take 500 mg by mouth as needed.    . ondansetron (ZOFRAN) 4 MG tablet Take 4 mg by mouth every 8 (eight) hours as needed for nausea or vomiting.    . propranolol ER (INDERAL LA) 60 MG 24 hr capsule Take 1 capsule (60 mg total) by mouth daily. 90 capsule 1   No current facility-administered medications for this visit.   Allergies  Allergen Reactions  . Aspirin Other (See Comments)    Nose bleeds  . Iodinated Diagnostic Agents Swelling  . Iodine Swelling      Review of Systems: CONSTITUTIONAL:  No  fever, no chills, No  unintentional weight changes HEAD/EYES/EARS/NOSE/THROAT: No  headache, no vision change, CARDIAC: No  chest pain,  No  pressure, No palpitations, No  orthopnea RESPIRATORY: No  cough, No  shortness of breath/wheeze GASTROINTESTINAL: No  nausea, No  vomiting, No  abdominal pain, No  blood in stool, No  diarrhea, No  Constipation, (+) heartburn as per HPI ENDOCRINE: No polyuria/polydipsia/polyphagia NEUROLOGIC: No  weakness, No  dizziness, No  slurred speech   Exam:  BP 115/80 mmHg  Pulse 92  Ht 5' 0.3" (1.532 m)  Wt 187 lb (84.823 kg)  BMI 36.14 kg/m2 Constitutional: VS see above. General Appearance: alert, well-developed, well-nourished, NAD Eyes: Normal lids and conjunctive, non-icteric  sclera, Ears, Nose, Mouth, Throat: MMM, Neck: No masses, trachea midline.  Respiratory: Normal respiratory effort. CTABL Cardiovascular: S1/S2 normal, no murmur, no rub/gallop auscultated. RRR.  Gastrointestinal: Nontender, no masses.  Skin: warm, dry, intact. No rash/ulcer. No concerning nevi or subq nodules on limited exam.   Psychiatric: Normal judgment/insight. Normal mood and affect.    Results for orders placed or performed in visit on 05/25/15 (from the past 72 hour(s))  POCT glycosylated hemoglobin (Hb A1C)     Status: None   Collection Time: 05/25/15 10:00 AM  Result Value Ref Range   Hemoglobin A1C 8.8    Reviewed ER records from last week, sugars in the 200s, though patient reports she went there in the morning fasting. EKG showed nonspecific T changes, reviewed patient's old EKG here, mild T-wave inversion. Cardiac enzymes were negative in the ER.   ASSESSMENT/PLAN:  Symptoms were concerning for GI cause chest discomfort, however incidental finding of hyperglycemia in the ER in patient with history of gestational diabetes wants further evaluation for diabetes/prediabetes, office A1c as above, will confirmed with blood draw, test for H pylori and initiate PPI therapy, plan to follow-up for reflux after about 6-12 weeks on PPI and depending on H. pylori labs. Patient information printed. ER/RTC precautions reviewed.   Gastroesophageal reflux disease, esophagitis presence not specified - Plan: H. pylori breath test, omeprazole (PRILOSEC) 20 MG capsule  Elevated fasting glucose - Plan: POCT glycosylated hemoglobin (Hb A1C)  Elevated hemoglobin A1c - Plan: COMPLETE METABOLIC PANEL WITH GFR, Hemoglobin A1c, CBC with Differential/Platelet  Return if symptoms worsen or fail to improve, and follow-up based on lab results (we will call by Weds).

## 2015-05-26 ENCOUNTER — Ambulatory Visit (INDEPENDENT_AMBULATORY_CARE_PROVIDER_SITE_OTHER): Payer: BLUE CROSS/BLUE SHIELD | Admitting: Osteopathic Medicine

## 2015-05-26 ENCOUNTER — Encounter: Payer: Self-pay | Admitting: Osteopathic Medicine

## 2015-05-26 VITALS — BP 101/67 | HR 93 | Wt 185.0 lb

## 2015-05-26 DIAGNOSIS — E119 Type 2 diabetes mellitus without complications: Secondary | ICD-10-CM | POA: Diagnosis not present

## 2015-05-26 LAB — H. PYLORI BREATH TEST: H. PYLORI BREATH TEST: NOT DETECTED

## 2015-05-26 MED ORDER — BLOOD GLUCOSE MONITOR KIT: PACK | Status: AC

## 2015-05-26 MED ORDER — METFORMIN HCL ER 500 MG PO TB24: 500.0000 mg | ORAL_TABLET | Freq: Every day | ORAL | Status: DC

## 2015-05-26 NOTE — Patient Instructions (Addendum)
Metformin: 1 tablet daily, increase gradually as directed to 1 tablet twice daily, then to 2 tablet in morning and 1 in evening, then to 2 tablets twice daily at which point we can switch the medicine to 1000 mg forms. Please call the office if any problems with the medicine or supplies.   We will call you about setting up appt for diabetic educator.   Plan to follow up in office in 3 months!   Type 2 Diabetes Mellitus, Adult Type 2 diabetes mellitus, often simply referred to as type 2 diabetes, is a long-lasting (chronic) disease. In type 2 diabetes, the pancreas does not make enough insulin (a hormone), the cells are less responsive to the insulin that is made (insulin resistance), or both. Normally, insulin moves sugars from food into the tissue cells. The tissue cells use the sugars for energy. The lack of insulin or the lack of normal response to insulin causes excess sugars to build up in the blood instead of going into the tissue cells. As a result, high blood sugar (hyperglycemia) develops. The effect of high sugar (glucose) levels can cause many complications. Type 2 diabetes was also previously called adult-onset diabetes, but it can occur at any age.  RISK FACTORS  A person is predisposed to developing type 2 diabetes if someone in the family has the disease and also has one or more of the following primary risk factors:  Weight gain, or being overweight or obese.  An inactive lifestyle.  A history of consistently eating high-calorie foods. Maintaining a normal weight and regular physical activity can reduce the chance of developing type 2 diabetes. SYMPTOMS  A person with type 2 diabetes may not show symptoms initially. The symptoms of type 2 diabetes appear slowly. The symptoms include:  Increased thirst (polydipsia).  Increased urination (polyuria).  Increased urination during the night (nocturia).  Sudden or unexplained weight changes.  Frequent, recurring  infections.  Tiredness (fatigue).  Weakness.  Vision changes, such as blurred vision.  Fruity smell to your breath.  Abdominal pain.  Nausea or vomiting.  Cuts or bruises which are slow to heal.  Tingling or numbness in the hands or feet.  An open skin wound (ulcer). DIAGNOSIS Type 2 diabetes is frequently not diagnosed until complications of diabetes are present. Type 2 diabetes is diagnosed when symptoms or complications are present and when blood glucose levels are increased. Your blood glucose level may be checked by one or more of the following blood tests:  A fasting blood glucose test. You will not be allowed to eat for at least 8 hours before a blood sample is taken.  A random blood glucose test. Your blood glucose is checked at any time of the day regardless of when you ate.  A hemoglobin A1c blood glucose test. A hemoglobin A1c test provides information about blood glucose control over the previous 3 months.  An oral glucose tolerance test (OGTT). Your blood glucose is measured after you have not eaten (fasted) for 2 hours and then after you drink a glucose-containing beverage. TREATMENT   You may need to take insulin or diabetes medicine daily to keep blood glucose levels in the desired range.  If you use insulin, you may need to adjust the dosage depending on the carbohydrates that you eat with each meal or snack.  Lifestyle changes are recommended as part of your treatment. These may include:  Following an individualized diet plan developed by a nutritionist or dietitian.  Exercising daily. Your health  care providers will set individualized treatment goals for you based on your age, your medicines, how long you have had diabetes, and any other medical conditions you have. Generally, the goal of treatment is to maintain the following blood glucose levels:  Before meals (preprandial): 80-130 mg/dL.  After meals (postprandial): below 180 mg/dL.  A1c: less than  6.5-7%. HOME CARE INSTRUCTIONS   Have your hemoglobin A1c level checked regularly.   Perform blood glucose monitoring as directed by your health care provider.  Monitor urine ketones when you are ill and as directed by your health care provider.  Take your diabetes medicine or insulin as directed by your health care provider to maintain your blood glucose levels in the desired range.  Never run out of diabetes medicine or insulin. It is needed every day.  If you are using insulin, you may need to adjust the amount of insulin given based on your intake of carbohydrates. Carbohydrates can raise blood glucose levels but need to be included in your diet. Carbohydrates provide vitamins, minerals, and fiber which are an essential part of a healthy diet. Carbohydrates are found in fruits, vegetables, whole grains, dairy products, legumes, and foods containing added sugars.  Eat healthy foods. You should make an appointment to see a registered dietitian to help you create an eating plan that is right for you.  Lose weight if you are overweight.  Carry a medical alert card or wear your medical alert jewelry.  Carry a 15-gram carbohydrate snack with you at all times to treat low blood glucose (hypoglycemia). Some examples of 15-gram carbohydrate snacks include:  Glucose tablets, 3 or 4.  Glucose gel, 15-gram tube.  Raisins, 2 tablespoons (24 grams).  Jelly beans, 6.  Animal crackers, 8.  Regular pop, 4 ounces (120 mL).  Gummy treats, 9.  Recognize hypoglycemia. Hypoglycemia occurs with blood glucose levels of 70 mg/dL and below. The risk for hypoglycemia increases when fasting or skipping meals, during or after intense exercise, and during sleep. Hypoglycemia symptoms can include:  Tremors or shakes.  Decreased ability to concentrate.  Sweating.  Increased heart rate.  Headache.  Dry mouth.  Hunger.  Irritability.  Anxiety.  Restless sleep.  Altered speech or  coordination.  Confusion.  Treat hypoglycemia promptly. If you are alert and able to safely swallow, follow the 15:15 rule:  Take 15-20 grams of rapid-acting glucose or carbohydrate. Rapid-acting options include glucose gel, glucose tablets, or 4 ounces (120 mL) of fruit juice, regular soda, or low-fat milk.  Check your blood glucose level 15 minutes after taking the glucose.  Take 15-20 grams more of glucose if the repeat blood glucose level is still 70 mg/dL or below.  Eat a meal or snack within 1 hour once blood glucose levels return to normal.  Be alert to feeling very thirsty and urinating more frequently than usual, which are early signs of hyperglycemia. An early awareness of hyperglycemia allows for prompt treatment. Treat hyperglycemia as directed by your health care provider.  Engage in at least 150 minutes of moderate-intensity physical activity a week, spread over at least 3 days of the week or as directed by your health care provider. In addition, you should engage in resistance exercise at least 2 times a week or as directed by your health care provider. Try to spend no more than 90 minutes at one time inactive.  Adjust your medicine and food intake as needed if you start a new exercise or sport.  Follow your sick-day plan  anytime you are unable to eat or drink as usual.  Do not use any tobacco products including cigarettes, chewing tobacco, or electronic cigarettes. If you need help quitting, ask your health care provider.  Limit alcohol intake to no more than 1 drink per day for nonpregnant women and 2 drinks per day for men. You should drink alcohol only when you are also eating food. Talk with your health care provider whether alcohol is safe for you. Tell your health care provider if you drink alcohol several times a week.  Keep all follow-up visits as directed by your health care provider. This is important.  Schedule an eye exam soon after the diagnosis of type 2  diabetes and then annually.  Perform daily skin and foot care. Examine your skin and feet daily for cuts, bruises, redness, nail problems, bleeding, blisters, or sores. A foot exam by a health care provider should be done annually.  Brush your teeth and gums at least twice a day and floss at least once a day. Follow up with your dentist regularly.  Share your diabetes management plan with your workplace or school.  Keep your immunizations up to date. It is recommended that you receive a flu (influenza) vaccine every year. It is also recommended that you receive a pneumonia (pneumococcal) vaccine. If you are 37 years of age or older and have never received a pneumonia vaccine, this vaccine may be given as a series of two separate shots. Ask your health care provider which additional vaccines may be recommended.  Learn to manage stress.  Obtain ongoing diabetes education and support as needed.  Participate in or seek rehabilitation as needed to maintain or improve independence and quality of life. Request a physical or occupational therapy referral if you are having foot or hand numbness, or difficulties with grooming, dressing, eating, or physical activity. SEEK MEDICAL CARE IF:   You are unable to eat food or drink fluids for more than 6 hours.  You have nausea and vomiting for more than 6 hours.  Your blood glucose level is over 240 mg/dL.  There is a change in mental status.  You develop an additional serious illness.  You have diarrhea for more than 6 hours.  You have been sick or have had a fever for a couple of days and are not getting better.  You have pain during any physical activity.  SEEK IMMEDIATE MEDICAL CARE IF:  You have difficulty breathing.  You have moderate to large ketone levels.   This information is not intended to replace advice given to you by your health care provider. Make sure you discuss any questions you have with your health care provider.    Document Released: 02/07/2005 Document Revised: 10/29/2014 Document Reviewed: 09/06/2011 Elsevier Interactive Patient Education 2016 [employer]  Basic Carbohydrate Counting for Diabetes Mellitus Carbohydrate counting is a method for keeping track of the amount of carbohydrates you eat. Eating carbohydrates naturally increases the level of sugar (glucose) in your blood, so it is important for you to know the amount that is okay for you to have in every meal. Carbohydrate counting helps keep the level of glucose in your blood within normal limits. The amount of carbohydrates allowed is different for every person. A dietitian can help you calculate the amount that is right for you. Once you know the amount of carbohydrates you can have, you can count the carbohydrates in the foods you want to eat. Carbohydrates are found in the following foods:  Grains, such as breads and cereals.  Dried beans and soy products.  Starchy vegetables, such as potatoes, peas, and corn.  Fruit and fruit juices.  Milk and yogurt.  Sweets and snack foods, such as cake, cookies, candy, chips, soft drinks, and fruit drinks. CARBOHYDRATE COUNTING There are two ways to count the carbohydrates in your food. You can use either of the methods or a combination of both. Reading the "Nutrition Facts" on Packaged Food The "Nutrition Facts" is an area that is included on the labels of almost all packaged food and beverages in the [place]. It includes the serving size of that food or beverage and information about the nutrients in each serving of the food, including the grams (g) of carbohydrate per serving.  Decide the number of servings of this food or beverage that you will be able to eat or drink. Multiply that number of servings by the number of grams of carbohydrate that is listed on the label for that serving. The total will be the amount of carbohydrates you will be having when you eat or drink this food or  beverage. Learning Standard Serving Sizes of Food When you eat food that is not packaged or does not include "Nutrition Facts" on the label, you need to measure the servings in order to count the amount of carbohydrates.A serving of most carbohydrate-rich foods contains about 15 g of carbohydrates. The following list includes serving sizes of carbohydrate-rich foods that provide 15 g ofcarbohydrate per serving:   1 slice of bread (1 oz) or 1 six-inch tortilla.    of a hamburger bun or English muffin.  4-6 crackers.   cup unsweetened dry cereal.    cup hot cereal.   cup rice or pasta.    cup mashed potatoes or  of a large baked potato.  1 cup fresh fruit or one small piece of fruit.    cup canned or frozen fruit or fruit juice.  1 cup milk.   cup plain fat-free yogurt or yogurt sweetened with artificial sweeteners.   cup cooked dried beans or starchy vegetable, such as peas, corn, or potatoes.  Decide the number of standard-size servings that you will eat. Multiply that number of servings by 15 (the grams of carbohydrates in that serving). For example, if you eat 2 cups of strawberries, you will have eaten 2 servings and 30 g of carbohydrates (2 servings x 15 g = 30 g). For foods such as soups and casseroles, in which more than one food is mixed in, you will need to count the carbohydrates in each food that is included. EXAMPLE OF CARBOHYDRATE COUNTING Sample Dinner  3 oz chicken breast.   cup of brown rice.   cup of corn.  1 cup milk.   1 cup strawberries with sugar-free whipped topping.  Carbohydrate Calculation Step 1: Identify the foods that contain carbohydrates:   Rice.   Corn.   Milk.   Strawberries. Step 2:Calculate the number of servings eaten of each:   2 servings of rice.   1 serving of corn.   1 serving of milk.   1 serving of strawberries. Step 3: Multiply each of those number of servings by 15 g:   2 servings of  rice x 15 g = 30 g.   1 serving of corn x 15 g = 15 g.   1 serving of milk x 15 g = 15 g.   1 serving of strawberries x 15 g = 15 g.  Step 4: Add together all of the amounts to find the total grams of carbohydrates eaten: 30 g + 15 g + 15 g + 15 g = 75 g.   This information is not intended to replace advice given to you by your health care provider. Make sure you discuss any questions you have with your health care provider.   Document Released: 02/07/2005 Document Revised: 02/28/2014 Document Reviewed: 01/04/2013 Elsevier Interactive Patient Education [employer]

## 2015-05-26 NOTE — Progress Notes (Signed)
HPI: Hannah Taylor is a 35 y.o. female who presents to [facility] today for chief complaint of:  Chief Complaint  Patient presents with  . Follow-up    Discuss Diabetes    Patient here for new diagnosis of diabetes. She does have a history of gestational diabetes, currently under treatment for hypertension which is well-controlled.   Past medical, social and family history reviewed: Past Medical History  Diagnosis Date  . Essential hypertension 12/30/2014  . Hypothyroidism 12/30/2014  . History of low back pain 12/30/2014  . Migraine without aura 12/30/2014  . Seasonal allergies 12/30/2014  . Vitamin D deficiency 12/30/2014  . GERD (gastroesophageal reflux disease) 12/30/2014    On Prilosec 20 ER  . Contraception management 12/30/2014    Depo Provera  . History of uterine fibroid 12/30/2014    S/p removal  . History of gestational diabetes 12/30/2014  . Preeclampsia 12/30/2014    History of   . Irritable bowel syndrome (IBS)   . H/O mammogram 01/07/2015    Normal 09/18/10  . PUD (peptic ulcer disease)    Past Surgical History  Procedure Laterality Date  . Breast reduction surgery    . Hernia repair    . Repair of perforated ulcer    . Cesarean section    . Robotic assisted laparoscopic vaginal hysterectomy with fibroid removal     Social History  Substance Use Topics  . Smoking status: Never Smoker   . Smokeless tobacco: Not on file  . Alcohol Use: 0.0 oz/week    0 Standard drinks or equivalent per week     Comment: Occasional   Family History  Problem Relation Age of Onset  . Diabetes Mother   . Hypertension Mother   . Hyperlipidemia Father   . Hypertension Father   . Diabetes Maternal Grandmother   . Kidney failure Maternal Grandmother     Current Outpatient Prescriptions  Medication Sig Dispense Refill  . amitriptyline (ELAVIL) 75 MG tablet Take 1 tablet (75 mg total) by mouth at bedtime. 30 tablet 0  . docusate sodium (COLACE)  50 MG capsule Take 1 capsule (50 mg total) by mouth 2 (two) times daily. 10 capsule 0  . levothyroxine (SYNTHROID, LEVOTHROID) 125 MCG tablet Take 1 tablet (125 mcg total) by mouth daily before breakfast. 90 tablet 1  . lisinopril-hydrochlorothiazide (PRINZIDE,ZESTORETIC) 20-25 MG tablet Take 1 tablet by mouth daily. PLEASE KEEP FOLLOW UP APPOINTMENT 90 tablet 1  . medroxyPROGESTERone (DEPO-PROVERA) 150 MG/ML injection Inject 1 mL (150 mg total) into the muscle every 3 (three) months. 1 mL 3  . naproxen (NAPROSYN) 500 MG tablet Take 500 mg by mouth as needed.    Marland Kitchen omeprazole (PRILOSEC) 20 MG capsule Take 1 capsule (20 mg total) by mouth 2 (two) times daily before a meal. 60 capsule 1  . ondansetron (ZOFRAN) 4 MG tablet Take 4 mg by mouth every 8 (eight) hours as needed for nausea or vomiting.    . propranolol ER (INDERAL LA) 60 MG 24 hr capsule Take 1 capsule (60 mg total) by mouth daily. 90 capsule 1   No current facility-administered medications for this visit.   Allergies  Allergen Reactions  . Aspirin Other (See Comments)    Nose bleeds  . Iodinated Diagnostic Agents Swelling  . Iodine Swelling      Review of Systems:  CARDIAC: No  chest pain, RESPIRATORY: No  cough ENDOCRINE: No polyuria/polydipsia/polyphagia, No  heat/cold intolerance   Exam:  BP 101/67  mmHg  Pulse 93  Wt 185 lb (83.915 kg) Constitutional: VS see above. General Appearance: alert, well-developed, well-nourished, NAD Eyes: Normal lids and conjunctive, non-icteric sclera,  Ears, Nose, Mouth, Throat: MMM, Neck: No masses, trachea midline Respiratory: Normal respiratory effort.  Cardiovascular: S1/S2 normal, no murmur, no rub/gallop auscultated. RRR.  Psychiatric: Normal judgment/insight. Normal mood and affect. Oriented x3.    Results for orders placed or performed in visit on 05/25/15 (from the past 72 hour(s))  H. pylori breath test     Status: None   Collection Time: 05/25/15  9:45 AM  Result Value Ref  Range   H. pylori Breath Test NOT DETECTED Not Detected    Comment:   Antimicrobials, proton pump inhibitors, and bismuth preparations are known to suppress H. pylori, and ingestion of these prior to H. pylori diagnostic testing may lead to false negative results. If clinically indicated, the test may be repeated on a new specimen obtained two weeks after discontinuing treatment.     POCT glycosylated hemoglobin (Hb A1C)     Status: None   Collection Time: 05/25/15 10:00 AM  Result Value Ref Range   Hemoglobin A1C 8.8   COMPLETE METABOLIC PANEL WITH GFR     Status: Abnormal   Collection Time: 05/25/15 10:17 AM  Result Value Ref Range   Sodium 137 135 - 146 mmol/L   Potassium 4.1 3.5 - 5.3 mmol/L   Chloride 104 98 - 110 mmol/L   CO2 24 20 - 31 mmol/L   Glucose, Bld 235 (H) 65 - 99 mg/dL   BUN 9 7 - 25 mg/dL   Creat 0.80 0.50 - 1.10 mg/dL   Total Bilirubin 0.3 0.2 - 1.2 mg/dL   Alkaline Phosphatase 73 33 - 115 U/L   AST 11 10 - 30 U/L   ALT 12 6 - 29 U/L   Total Protein 6.5 6.1 - 8.1 g/dL   Albumin 4.0 3.6 - 5.1 g/dL   Calcium 8.9 8.6 - 10.2 mg/dL   GFR, Est African American >89 >=60 mL/min   GFR, Est Non African American >89 >=60 mL/min    Comment:   The estimated GFR is a calculation valid for adults (>=15 years old) that uses the CKD-EPI algorithm to adjust for age and sex. It is   not to be used for children, pregnant women, hospitalized patients,    patients on dialysis, or with rapidly changing kidney function. According to the NKDEP, eGFR >89 is normal, 60-89 shows mild impairment, 30-59 shows moderate impairment, 15-29 shows severe impairment and <15 is ESRD.     Hemoglobin A1c     Status: Abnormal   Collection Time: 05/25/15 10:17 AM  Result Value Ref Range   Hgb A1c MFr Bld 8.4 (H) <5.7 %    Comment:   For someone without known diabetes, a hemoglobin A1c value of 6.5% or greater indicates that they may have diabetes and this should be confirmed with a  follow-up test.   For someone with known diabetes, a value <7% indicates that their diabetes is well controlled and a value greater than or equal to 7% indicates suboptimal control. A1c targets should be individualized based on duration of diabetes, age, comorbid conditions, and other considerations.   Currently, no consensus exists for use of hemoglobin A1c for diagnosis of diabetes for children.      Mean Plasma Glucose 194 mg/dL  CBC with Differential/Platelet     Status: Abnormal   Collection Time: 05/25/15 10:17 AM  Result  Value Ref Range   WBC 6.1 3.8 - 10.8 K/uL   RBC 3.85 3.80 - 5.10 MIL/uL   Hemoglobin 11.3 (L) 11.7 - 15.5 g/dL   HCT 34.6 (L) 35.0 - 45.0 %   MCV 89.9 80.0 - 100.0 fL   MCH 29.4 27.0 - 33.0 pg   MCHC 32.7 32.0 - 36.0 g/dL   RDW 12.7 11.0 - 15.0 %   Platelets 272 140 - 400 K/uL   MPV 9.4 7.5 - 12.5 fL   Neutro Abs 3599 1500 - 7800 cells/uL   Lymphs Abs 2074 850 - 3900 cells/uL   Monocytes Absolute 305 200 - 950 cells/uL   Eosinophils Absolute 122 15 - 500 cells/uL   Basophils Absolute 0 0 - 200 cells/uL   Neutrophils Relative % 59 %   Lymphocytes Relative 34 %   Monocytes Relative 5 %   Eosinophils Relative 2 %   Basophils Relative 0 %   Smear Review Criteria for review not met     Comment: ** Please note change in unit of measure and reference range(s). **      ASSESSMENT/PLAN: Patient educated on diagnosis, natural history of diabetes illness, medication options, importance of glucose control over long-term, importance of routine monitoring for secondary problems/complications. Plan to follow-up with diabetic educator. Patient advised can be checking sugars a few times a week, fasting in the morning. Advised on slow to take low titration of metformin to avoid serious side effects but advised she can certainly expect some GI upset/loose stool. Plan to follow-up in 3 months for repeat A1c and catch-up on other preventive care measures, return to clinic  sooner if needed.  Type 2 diabetes mellitus without complication, without long-term current use of insulin ([place]) - Plan: metFORMIN (GLUCOPHAGE XR) 500 MG 24 hr tablet, blood glucose meter kit and supplies KIT, Ambulatory referral to diabetic education   Total time spent 40 minutes, greater than 50% of the visit was counseling and coordinating care for diagnosis of DIABETES TYPE 2 NEW DIAGNOSIS.   Return in about 3 months (around 08/25/2015), or sooner if needed, for DIABETES AND BLOOD PRESSURE FOLLOW-UP (OV30) - CAN CANEL OTHER APPTS.

## 2015-06-02 NOTE — Telephone Encounter (Signed)
Noted  

## 2015-06-02 NOTE — Telephone Encounter (Signed)
Called and checked on the status of PA and they kicked the second PA out saying another PA cannot be submitted for another 180 days. The options are a PCR which is where you fax documentation showing that the patient has tried Qsymia( the insurance is deninng this because they say she needs to have tried and failed Qsymia) or a peer to peer. An appeal can be submitted but the patient has to do it. The only options for the provider or the ones mentioned before. Does the patient have any contraindications to Qsymia?

## 2015-06-02 NOTE — Telephone Encounter (Signed)
No contraindications to Qsymia. I think the patient and I spoke about this option briefly before she decided on Saxenda.  I dont think she has any contraindications to Qsymia however I believe she was concerned about the phentermine component. I think for now she is opting to go with lifestyle changes, I will address this with her at her next follow-up, but thanks for checking back with me on this.

## 2015-06-19 ENCOUNTER — Ambulatory Visit: Payer: BLUE CROSS/BLUE SHIELD

## 2015-06-22 ENCOUNTER — Telehealth: Payer: Self-pay

## 2015-06-22 NOTE — Telephone Encounter (Signed)
Patient called request something for constipation. She stated that Metformin has been making her go but she hasn't had 1in a couple of days. Please advise. Terika Pillard,CMA

## 2015-06-23 ENCOUNTER — Encounter: Payer: Self-pay | Admitting: *Deleted

## 2015-06-23 ENCOUNTER — Emergency Department (INDEPENDENT_AMBULATORY_CARE_PROVIDER_SITE_OTHER)
Admission: EM | Admit: 2015-06-23 | Discharge: 2015-06-23 | Disposition: A | Discharge status: Home / Self Care | Payer: Medicaid Other | Source: Home / Self Care | Attending: Family Medicine | Admitting: Family Medicine

## 2015-06-23 ENCOUNTER — Emergency Department (INDEPENDENT_AMBULATORY_CARE_PROVIDER_SITE_OTHER): Payer: BLUE CROSS/BLUE SHIELD

## 2015-06-23 DIAGNOSIS — K5900 Constipation, unspecified: Secondary | ICD-10-CM

## 2015-06-23 DIAGNOSIS — R109 Unspecified abdominal pain: Secondary | ICD-10-CM | POA: Diagnosis not present

## 2015-06-23 DIAGNOSIS — K5909 Other constipation: Secondary | ICD-10-CM

## 2015-06-23 LAB — POCT URINALYSIS DIPSTICK
Leukocytes, UA: NEGATIVE
NITRITE UA: NEGATIVE
PH UA: 6 (ref 5–8)
UROBILINOGEN UA: 1 (ref 0–1)

## 2015-06-23 LAB — POCT CBC W AUTO DIFF (K'VILLE URGENT CARE)

## 2015-06-23 LAB — COMPLETE METABOLIC PANEL WITH GFR
ALT: 14 U/L (ref 6–29)
AST: 13 U/L (ref 10–30)
Albumin: 4.4 g/dL (ref 3.6–5.1)
Alkaline Phosphatase: 65 U/L (ref 33–115)
BILIRUBIN TOTAL: 0.4 mg/dL (ref 0.2–1.2)
BUN: 13 mg/dL (ref 7–25)
CO2: 22 mmol/L (ref 20–31)
Calcium: 9.5 mg/dL (ref 8.6–10.2)
Chloride: 105 mmol/L (ref 98–110)
Creat: 0.81 mg/dL (ref 0.50–1.10)
Glucose, Bld: 170 mg/dL — ABNORMAL HIGH (ref 65–99)
POTASSIUM: 3.8 mmol/L (ref 3.5–5.3)
Sodium: 138 mmol/L (ref 135–146)
TOTAL PROTEIN: 6.8 g/dL (ref 6.1–8.1)

## 2015-06-23 LAB — TSH: TSH: 11.21 mIU/L — ABNORMAL HIGH

## 2015-06-23 LAB — T3, FREE: T3 FREE: 2.1 pg/mL — AB (ref 2.3–4.2)

## 2015-06-23 LAB — POCT URINE PREGNANCY: PREG TEST UR: NEGATIVE

## 2015-06-23 LAB — T4, FREE: FREE T4: 1.4 ng/dL (ref 0.8–1.8)

## 2015-06-23 MED ORDER — BISACODYL 10 MG RE SUPP: 10.0000 mg | RECTAL | Status: DC | PRN

## 2015-06-23 MED ORDER — MINERAL OIL RE ENEM: 1.0000 | ENEMA | Freq: Once | RECTAL | Status: DC

## 2015-06-23 MED ORDER — POLYETHYLENE GLYCOL 3350 17 GM/SCOOP PO POWD: 17.0000 g | Freq: Every day | ORAL | Status: AC

## 2015-06-23 NOTE — Discharge Instructions (Signed)
Begin a liquid diet for 2 to 3 days (broth, Ensure, etc) until constipation improves. Increase Miralax to 2 capfuls (34gm) mixed in 8 ounces water once daily.  Insert one bisacodyl suppository in rectum once daily, and retain for 15 to 20 minutes.  Use one Fleet mineral oil enema. If symptoms become significantly worse during the night or over the weekend, proceed to the local emergency room.    Constipation, Adult Constipation is when a person has fewer than three bowel movements a week, has difficulty having a bowel movement, or has stools that are dry, hard, or larger than normal. As people grow older, constipation is more common. A low-fiber diet, not taking in enough fluids, and taking certain medicines may make constipation worse.  CAUSES   Certain medicines, such as antidepressants, pain medicine, iron supplements, antacids, and water pills.   Certain diseases, such as diabetes, irritable bowel syndrome (IBS), thyroid disease, or depression.   Not drinking enough water.   Not eating enough fiber-rich foods.   Stress or travel.   Lack of physical activity or exercise.   Ignoring the urge to have a bowel movement.   Using laxatives too much.  SIGNS AND SYMPTOMS   Having fewer than three bowel movements a week.   Straining to have a bowel movement.   Having stools that are hard, dry, or larger than normal.   Feeling full or bloated.   Pain in the lower abdomen.   Not feeling relief after having a bowel movement.  DIAGNOSIS  Your health care provider will take a medical history and perform a physical exam. Further testing may be done for severe constipation. Some tests may include:  A barium enema X-ray to examine your rectum, colon, and, sometimes, your small intestine.   A sigmoidoscopy to examine your lower colon.   A colonoscopy to examine your entire colon. TREATMENT  Treatment will depend on the severity of your constipation and what is causing  it. Some dietary treatments include drinking more fluids and eating more fiber-rich foods. Lifestyle treatments may include regular exercise. If these diet and lifestyle recommendations do not help, your health care provider may recommend taking over-the-counter laxative medicines to help you have bowel movements. Prescription medicines may be prescribed if over-the-counter medicines do not work.  HOME CARE INSTRUCTIONS   Eat foods that have a lot of fiber, such as fruits, vegetables, whole grains, and beans.  Limit foods high in fat and processed sugars, such as french fries, hamburgers, cookies, candies, and soda.   A fiber supplement may be added to your diet if you cannot get enough fiber from foods.   Drink enough fluids to keep your urine clear or pale yellow.   Exercise regularly or as directed by your health care provider.   Go to the restroom when you have the urge to go. Do not hold it.   Only take over-the-counter or prescription medicines as directed by your health care provider. Do not take other medicines for constipation without talking to your health care provider first.  SEEK IMMEDIATE MEDICAL CARE IF:   You have bright red blood in your stool.   Your constipation lasts for more than 4 days or gets worse.   You have abdominal or rectal pain.   You have thin, pencil-like stools.   You have unexplained weight loss. MAKE SURE YOU:   Understand these instructions.  Will watch your condition.  Will get help right away if you are not doing well or  get worse.   This information is not intended to replace advice given to you by your health care provider. Make sure you discuss any questions you have with your health care provider.   Document Released: 11/06/2003 Document Revised: 02/28/2014 Document Reviewed: 11/19/2012 Elsevier Interactive Patient Education [employer]

## 2015-06-23 NOTE — ED Provider Notes (Addendum)
CSN: [id number]     Arrival date & time 06/23/15  0831 History   None    Chief Complaint  Patient presents with  . Constipation      HPI Comments: Patient has a history of IBS and constipation.   Approximately one month ago she was started on omeprazole for GERD, and metformin for type 2 diabetes (Hgb A1c was 8.4).  She recalls that her bowel movements became more regular for a short period of time after beginning metformin.  However, she then developed decreased frequency of bowel movements, and has now not had a bowel movement for over two weeks.  She has developed constant abdominal pain.  During the past week she has developed nausea, anorexia, and occasional vomiting of undigested food.  She has also had intermittent daytime sweats during the past several weeks.  Her constipation has not responded to daily Miralax, and several doses of Dulcolax tabs and prune juice.  She even tried a single dose of Amitiza about 2 weeks ago.   She notes that she had her last dose of Depo-Provera about 3 weeks ago, and has had persistent vaginal spotting. Past surgical history includes repair of bleeding peptic ulcer, umbilical hernia repair, C-section, and laparascopic uterine fibroid resection.  Patient is a 35 y.o. female presenting with abdominal pain.  Abdominal Pain Pain location:  Periumbilical and LLQ Pain quality: bloating and fullness   Pain radiates to:  Back Pain severity:  Mild Onset quality:  Gradual Duration:  4 weeks Timing:  Constant Progression:  Worsening Chronicity:  New Context: awakening from sleep, eating and previous surgery   Context: not diet changes, not recent illness, not recent travel, not sick contacts and not suspicious food intake   Relieved by:  Nothing Worsened by:  Movement, eating and palpation Ineffective treatments: Miralax, Dulcolax, prune juice. Associated symptoms: anorexia, belching, chills, constipation, fatigue, nausea, vaginal bleeding and vomiting    Associated symptoms: no chest pain, no cough, no diarrhea, no dysuria, no fever, no flatus, no hematemesis, no hematochezia, no hematuria, no melena, no shortness of breath and no vaginal discharge   Risk factors: multiple surgeries and obesity     Past Medical History  Diagnosis Date  . Essential hypertension 12/30/2014  . Hypothyroidism 12/30/2014  . History of low back pain 12/30/2014  . Migraine without aura 12/30/2014  . Seasonal allergies 12/30/2014  . Vitamin D deficiency 12/30/2014  . GERD (gastroesophageal reflux disease) 12/30/2014    On Prilosec 20 ER  . Contraception management 12/30/2014    Depo Provera  . History of uterine fibroid 12/30/2014    S/p removal  . History of gestational diabetes 12/30/2014  . Preeclampsia 12/30/2014    History of   . Irritable bowel syndrome (IBS)   . H/O mammogram 01/07/2015    Normal 09/18/10  . PUD (peptic ulcer disease)   . Diabetes mellitus without complication [facility])    Past Surgical History  Procedure Laterality Date  . Breast reduction surgery    . Hernia repair    . Repair of perforated ulcer    . Cesarean section    . Robotic assisted laparoscopic vaginal hysterectomy with fibroid removal     Family History  Problem Relation Age of Onset  . Diabetes Mother   . Hypertension Mother   . Hyperlipidemia Father   . Hypertension Father   . Diabetes Maternal Grandmother   . Kidney failure Maternal Grandmother    Social History  Substance Use Topics  . Smoking  status: Never Smoker   . Smokeless tobacco: None  . Alcohol Use: 0.0 oz/week    0 Standard drinks or equivalent per week     Comment: Occasional   OB History    No data available     Review of Systems  Constitutional: Positive for chills and fatigue. Negative for fever.  Respiratory: Negative for cough and shortness of breath.   Cardiovascular: Negative for chest pain.  Gastrointestinal: Positive for nausea, vomiting, abdominal pain, constipation and anorexia.  Negative for diarrhea, melena, hematochezia, flatus and hematemesis.  Genitourinary: Positive for vaginal bleeding. Negative for dysuria, hematuria and vaginal discharge.  All other systems reviewed and are negative.   Allergies  Aspirin; Iodinated diagnostic agents; and Iodine  Home Medications   Prior to Admission medications   Medication Sig Start Date End Date Taking? Authorizing Provider  amitriptyline (ELAVIL) 75 MG tablet Take 1 tablet (75 mg total) by mouth at bedtime. 05/19/15   Emeterio Reeve, DO  bisacodyl (DULCOLAX) 10 MG suppository Place 1 suppository (10 mg total) rectally as needed for moderate constipation. 06/23/15   Kandra Nicolas, MD  blood glucose meter kit and supplies KIT Dispense based on patient and insurance preference. Use up to four times daily as directed. Please include lancets, test strips, control solution. 05/26/15   Emeterio Reeve, DO  docusate sodium (COLACE) 50 MG capsule Take 1 capsule (50 mg total) by mouth 2 (two) times daily. 12/30/14   Emeterio Reeve, DO  levothyroxine (SYNTHROID, LEVOTHROID) 125 MCG tablet Take 1 tablet (125 mcg total) by mouth daily before breakfast. 05/20/15   Emeterio Reeve, DO  lisinopril-hydrochlorothiazide (PRINZIDE,ZESTORETIC) 20-25 MG tablet Take 1 tablet by mouth daily. PLEASE KEEP FOLLOW UP APPOINTMENT 05/19/15   Emeterio Reeve, DO  medroxyPROGESTERone (DEPO-PROVERA) 150 MG/ML injection Inject 1 mL (150 mg total) into the muscle every 3 (three) months. 02/24/15   Emeterio Reeve, DO  metFORMIN (GLUCOPHAGE XR) 500 MG 24 hr tablet Take 1 tablet (500 mg total) by mouth daily with breakfast. 05/26/15   Emeterio Reeve, DO  mineral oil enema Place 133 mLs (1 enema total) rectally once. 06/23/15   Kandra Nicolas, MD  naproxen (NAPROSYN) 500 MG tablet Take 500 mg by mouth as needed.    Historical Provider, MD  omeprazole (PRILOSEC) 20 MG capsule Take 1 capsule (20 mg total) by mouth 2 (two) times daily before a meal. 05/25/15    Emeterio Reeve, DO  ondansetron (ZOFRAN) 4 MG tablet Take 4 mg by mouth every 8 (eight) hours as needed for nausea or vomiting.    Historical Provider, MD  polyethylene glycol powder (GLYCOLAX/MIRALAX) powder Take 17 g by mouth daily. 06/23/15   Gregor Hams, MD  propranolol ER (INDERAL LA) 60 MG 24 hr capsule Take 1 capsule (60 mg total) by mouth daily. 05/19/15   Emeterio Reeve, DO   Meds Ordered and Administered this Visit  Medications - No data to display  BP 111/74 mmHg  Pulse 95  Temp(Src) 98.4 F (36.9 C) (Oral)  Ht _0  (1.6 m)  Wt 182 lb (82.555 kg)  BMI 32.25 kg/m2  SpO2 98%  LMP 06/09/2015 No data found.   Physical Exam Nursing notes and Vital Signs reviewed. Appearance:  Patient appears stated age, and in no acute distress.  She is alert and oriented.  Eyes:  Pupils are equal, round, and reactive to light and accomodation.  Extraocular movement is intact.  Conjunctivae are not inflamed  Nose:  Normal Pharynx:  Normal; moist  mucous membranes  Neck:  Supple.  No adenopathy or thyromegaly Lungs:  Clear to auscultation.  Breath sounds are equal.  Moving air well. Heart:  Regular rate and rhythm without murmurs, rubs, or gallops.  Rate 100 Abdomen:  Soft without masses or hepatosplenomegaly.  Mildly distended.  Tenderness to palpation peri-umbilical and left lower quadrant.  Well-healed surgical scars present.  No rebound tenderness or guarding.  Bowel sounds are present.  No CVA or flank tenderness.  Extremities:  No edema.  Skin:  No rash present.   ED Course  Procedures none    Labs Reviewed  COMPLETE METABOLIC PANEL WITH GFR  TSH  T3, FREE  T4, FREE  POCT CBC W AUTO DIFF ([facility]):  WBC 6.4; LY 38.0; MO 2.6; GR 59.4; Hgb 10.1; Platelets 331   POCT URINALYSIS DIPSTICK:  GLU 137m/dL; BIL small; KET trace; SG >= 1.030; BLO moderate; PRO 352mdL NIT negative; LEU negative; pH 6.0  POCT URINE PREGNANCY negative    Imaging Review Ct Abdomen Pelvis  Wo Contrast  06/23/2015  CLINICAL DATA:  Constipation, abdominal pain for 2 weeks EXAM: CT ABDOMEN AND PELVIS WITHOUT CONTRAST TECHNIQUE: Multidetector CT imaging of the abdomen and pelvis was performed following the standard protocol without IV contrast. COMPARISON:  None. FINDINGS: Lower chest:  Lung bases are clear.  Normal heart size. Hepatobiliary: Normal liver.  Normal gallbladder. Pancreas: Normal. Spleen: Normal. Adrenals/Urinary Tract: Normal adrenal glands. Normal kidneys. No urolithiasis or obstructive uropathy. Decompressed bladder. Stomach/Bowel: No bowel wall thickening or dilatation. No pneumatosis, pneumoperitoneum or portal venous gas. Large amount of stool throughout the colon. No abdominal or pelvic free fluid. Small fat containing umbilical hernia. Vascular/Lymphatic: Normal caliber abdominal aorta. No lymphadenopathy. Reproductive: Normal uterus.  Prior hysterectomy. Other: No fluid collection or hematoma. Musculoskeletal: No lytic or sclerotic osseous lesion. No acute osseous abnormality. IMPRESSION: 1. No acute abdominal or pelvic pathology. 2. Large amount of stool throughout the colon. Electronically Signed   By: HeKathreen Devoid On: 06/23/2015 12:40      MDM   1. Chronic constipation; normal white blood count and and negative CT scan abdomen/pelvis reassuring.   Note history of hypothyroid.  Thyroid studies pending. Note anemia:  Hgb 10.1 (previous Hgb 11.3 on 05/25/15) CMP pending. Begin a liquid diet for 2 to 3 days (broth, Ensure, etc) until constipation improves. Increase Miralax to 2 capfuls (34gm) mixed in 8 ounces water once daily.  Insert one bisacodyl suppository in rectum once daily, and retain for 15 to 20 minutes.  Use one Fleet mineral oil enema. If symptoms become significantly worse during the night or over the weekend, proceed to the local emergency room.  Followup with PCP in 3 days for further management.    StKandra NicolasMD 06/23/15  1322  Addendum Elevated TSH (11.21) with normal Free T4 (1.4):  Subclinical hypothyroid. Note decreased Free T3 (2.1) Anticipated dose T4 = 1.52m37mkg/day X 82.6kg = 132m62mill increase Synthroid to 137mc50my.  Suggest repeating TSH in 6 weeks.  StephKandra Nicolas05/03/17 1104

## 2015-06-23 NOTE — Telephone Encounter (Signed)
I sent in Miralax powder.

## 2015-06-23 NOTE — Telephone Encounter (Signed)
Left detailed message on patient vm with information as noted below. Rhonda Cunningham,CMA  

## 2015-06-23 NOTE — ED Notes (Signed)
Pt reports constipation/no bowel movement since about 06/01/15, shortly after starting metformin. Has a h/o irregularity but not this bad. Taken Miralax, Dulcolax and drank prune juice. C/o cramping/full feeling, worsening reflux since starting omeprazole. This morning vomited solid food that she ate 2 days ago.

## 2015-06-24 ENCOUNTER — Telehealth: Payer: Self-pay | Admitting: *Deleted

## 2015-06-24 MED ORDER — SYNTHROID 137 MCG PO TABS: 137.0000 ug | ORAL_TABLET | Freq: Every day | ORAL | Status: DC

## 2015-06-29 ENCOUNTER — Ambulatory Visit: Payer: BLUE CROSS/BLUE SHIELD | Admitting: Dietician

## 2015-07-28 ENCOUNTER — Encounter: Payer: Self-pay | Admitting: Osteopathic Medicine

## 2015-07-28 ENCOUNTER — Ambulatory Visit: Payer: BLUE CROSS/BLUE SHIELD | Admitting: Dietician

## 2015-07-28 DIAGNOSIS — Z5329 Procedure and treatment not carried out because of patient's decision for other reasons: Secondary | ICD-10-CM | POA: Insufficient documentation

## 2015-07-28 DIAGNOSIS — Z91199 Patient's noncompliance with other medical treatment and regimen due to unspecified reason: Secondary | ICD-10-CM | POA: Insufficient documentation

## 2015-08-06 ENCOUNTER — Encounter: Payer: 59 | Admitting: Osteopathic Medicine

## 2015-09-01 ENCOUNTER — Ambulatory Visit: Payer: BLUE CROSS/BLUE SHIELD | Admitting: Osteopathic Medicine

## 2015-09-21 ENCOUNTER — Encounter: Payer: Self-pay | Admitting: Osteopathic Medicine

## 2015-09-21 ENCOUNTER — Ambulatory Visit (INDEPENDENT_AMBULATORY_CARE_PROVIDER_SITE_OTHER): Payer: Medicaid Other | Admitting: Osteopathic Medicine

## 2015-09-21 VITALS — BP 125/85 | HR 67 | Ht 63.0 in | Wt 183.0 lb

## 2015-09-21 DIAGNOSIS — M2550 Pain in unspecified joint: Secondary | ICD-10-CM

## 2015-09-21 DIAGNOSIS — Z23 Encounter for immunization: Secondary | ICD-10-CM | POA: Diagnosis not present

## 2015-09-21 DIAGNOSIS — G894 Chronic pain syndrome: Secondary | ICD-10-CM

## 2015-09-21 DIAGNOSIS — E119 Type 2 diabetes mellitus without complications: Secondary | ICD-10-CM

## 2015-09-21 DIAGNOSIS — D649 Anemia, unspecified: Secondary | ICD-10-CM

## 2015-09-21 DIAGNOSIS — E039 Hypothyroidism, unspecified: Secondary | ICD-10-CM

## 2015-09-21 LAB — POCT GLYCOSYLATED HEMOGLOBIN (HGB A1C): Hemoglobin A1C: 6.4

## 2015-09-21 NOTE — Addendum Note (Signed)
Addended by: Deirdre Pippins on: 09/21/2015 02:25 PM   Modules accepted: Orders

## 2015-09-21 NOTE — Progress Notes (Signed)
HPI: Hannah Taylor is a 35 y.o. Not Hispanic or Latino female  who presents to [facility] today, 09/21/15,  for chief complaint of:  Chief Complaint  Patient presents with  . Follow-up    BLOOD PRESSURE  . Diabetes     DIABETES SCREENING/PREVENTIVE CARE: Updated 09/21/15  A1C past 3-6 mos: Yes 09/21/15  controlled? Yes  At 6.4%  Previously 8.4% 05/25/2015 BP goal <140/90: Yes  LDL goal <70: Yes 12/11/2014 Eye exam annually: Yes , importance discussed with patient Foot exam: Yes  Microalbuminuria: n/a - on ACE Metformin: Yes  ACE/ARB: Yes  Antiplatelet if ASCVD Risk >10%: No  Statin: No  Pneumovax: not yet   HYPOTHYROID - last TSH on file from Urgent Care was 11.21 06/23/2015.   ANEMIA - Hgb low in UC 06/23/2015 10.1.    Past medical, surgical, social and family history reviewed: Past Medical History:  Diagnosis Date  . Contraception management 12/30/2014   Depo Provera  . Diabetes mellitus without complication ([place])   . Essential hypertension 12/30/2014  . GERD (gastroesophageal reflux disease) 12/30/2014   On Prilosec 20 ER  . H/O mammogram 01/07/2015   Normal 09/18/10  . History of gestational diabetes 12/30/2014  . History of low back pain 12/30/2014  . History of uterine fibroid 12/30/2014   S/p removal  . Hypothyroidism 12/30/2014  . Irritable bowel syndrome (IBS)   . Migraine without aura 12/30/2014  . Preeclampsia 12/30/2014   History of   . PUD (peptic ulcer disease)   . Seasonal allergies 12/30/2014  . Vitamin D deficiency 12/30/2014   Past Surgical History:  Procedure Laterality Date  . BREAST REDUCTION SURGERY    . CESAREAN SECTION    . HERNIA REPAIR    . REPAIR OF PERFORATED ULCER    . ROBOTIC ASSISTED LAPAROSCOPIC VAGINAL HYSTERECTOMY WITH FIBROID REMOVAL     Social History  Substance Use Topics  . Smoking status: Never Smoker  . Smokeless tobacco: Not on file  . Alcohol use 0.0 oz/week     Comment: Occasional    Family History  Problem Relation Age of Onset  . Diabetes Mother   . Hypertension Mother   . Hyperlipidemia Father   . Hypertension Father   . Diabetes Maternal Grandmother   . Kidney failure Maternal Grandmother      Current medication list and allergy/intolerance information reviewed:   Current Outpatient Prescriptions  Medication Sig Dispense Refill  . amitriptyline (ELAVIL) 75 MG tablet Take 1 tablet (75 mg total) by mouth at bedtime. 30 tablet 0  . bisacodyl (DULCOLAX) 10 MG suppository Place 1 suppository (10 mg total) rectally as needed for moderate constipation. 12 suppository 0  . blood glucose meter kit and supplies KIT Dispense based on patient and insurance preference. Use up to four times daily as directed. Please include lancets, test strips, control solution. 1 each 99  . docusate sodium (COLACE) 50 MG capsule Take 1 capsule (50 mg total) by mouth 2 (two) times daily. 10 capsule 0  . lisinopril-hydrochlorothiazide (PRINZIDE,ZESTORETIC) 20-25 MG tablet Take 1 tablet by mouth daily. PLEASE KEEP FOLLOW UP APPOINTMENT 90 tablet 1  . medroxyPROGESTERone (DEPO-PROVERA) 150 MG/ML injection Inject 1 mL (150 mg total) into the muscle every 3 (three) months. 1 mL 3  . metFORMIN (GLUCOPHAGE XR) 500 MG 24 hr tablet Take 1 tablet (500 mg total) by mouth daily with breakfast. 30 tablet 11  . mineral oil enema Place 133 mLs (1 enema total) rectally  once. 133 mL 0  . naproxen (NAPROSYN) 500 MG tablet Take 500 mg by mouth as needed.    Marland Kitchen omeprazole (PRILOSEC) 20 MG capsule Take 1 capsule (20 mg total) by mouth 2 (two) times daily before a meal. 60 capsule 1  . ondansetron (ZOFRAN) 4 MG tablet Take 4 mg by mouth every 8 (eight) hours as needed for nausea or vomiting.    . polyethylene glycol powder (GLYCOLAX/MIRALAX) powder Take 17 g by mouth daily. 850 g 1  . propranolol ER (INDERAL LA) 60 MG 24 hr capsule Take 1 capsule (60 mg total) by mouth daily. 90 capsule 1  . SYNTHROID 137 MCG  tablet Take 1 tablet (137 mcg total) by mouth daily before breakfast. 30 tablet 1   No current facility-administered medications for this visit.    Allergies  Allergen Reactions  . Aspirin Other (See Comments)    Nose bleeds  . Iodinated Diagnostic Agents Swelling  . Iodine Swelling      Review of Systems:  Constitutional:  No  fever, no chills, No recent illness, No unintentional weight changes. (+) chronic significant fatigue.   HEENT: No  headache  Cardiac: No  chest pain, No  pressure  Respiratory:  No  shortness of breath.  Gastrointestinal: No  abdominal pain, No  nausea, No  vomiting  Musculoskeletal: No new myalgia/arthralgia  Skin: No  Rash today , Reports occasional intermittent rash on arms  Hem/Onc: No  easy bruising/bleeding, No  abnormal lymph node  Neurologic: No  weakness, No  dizziness,   Exam:  BP 125/85   Pulse 67   Ht _0  (1.6 m)   Wt 183 lb (83 kg)   BMI 32.42 kg/m   Constitutional: VS see above. General Appearance: alert, well-developed, well-nourished, NAD  Ears, Nose, Mouth, Throat: MMM, Normal external inspection ears/nares/mouth/lips/gums  Neck: No masses, trachea midline.  Respiratory: Normal respiratory effort. no wheeze, no rhonchi, no rales  Cardiovascular: S1/S2 normal, no murmur, no rub/gallop auscultated. RRR. No lower extremity edema. Pedal pulse II/IV bilaterally DP and PT.   Musculoskeletal: Gait normal. No clubbing/cyanosis of digits.   Neurological: No cranial nerve deficit on limited exam. Normal balance/coordination. No tremor.   Skin: warm, dry, intact. No rash/ulcer. No concerning nevi or subq nodules on limited exam.    Psychiatric: Normal judgment/insight. Normal mood and affect. Oriented x3.      ASSESSMENT/PLAN:   Patient requests testing for further workup of possible rheumatologic/autoimmune problems that may be causing her chronic pain. Workup as below. Updated diabetic preventive care with exam today  and Pneumovax. A1c indicates good control  Reports some grogginess on the amitriptyline, will trial half dose. Consider restart Cymbalta, patient to make separate appointment to discuss chronic pain issues, depending on lab results.  Type 2 diabetes mellitus without complication, without long-term current use of insulin ([place]) - Plan: POCT HgB A1C  Chronic pain disorder  Hypothyroidism, unspecified hypothyroidism type - Plan: TSH, T4, free  Joint pain - Plan: ANA, CK, High sensitivity CRP, Rheumatoid factor, Sedimentation rate  Anemia, unspecified anemia type - Plan: CBC with Differential/Platelet  Need for 23-polyvalent pneumococcal polysaccharide vaccine - Plan: Pneumococcal polysaccharide vaccine 23-valent greater than or equal to 2yo subcutaneous/IM     Visit summary with medication list and pertinent instructions was printed for patient to review. All questions at time of visit were answered - patient instructed to contact office with any additional concerns. ER/RTC precautions were reviewed with the patient. Follow-up plan: Return in  about 3 months (around 12/22/2015), or Sooner if needed/depending on lab results and for discussion of chronic pain management  if labs ok, for DIABETES FOLLOW-UP .

## 2015-09-21 NOTE — Patient Instructions (Addendum)
We'll try dosing amitriptyline at half a dose, hopefully this will decrease the amount of grogginess you are experiencing. We will get some labs to further evaluate for possible autoimmune/rheumatologic causes of chronic pain. Depending on results of these labs, we will discuss further follow-up. Plan to return to the office in 3 months for repeat hemoglobin A1c to monitor diabetes control, and in the meantime will continue metformin. If joint pain worsens, but plan to make a separate appointment to discuss medication management of chronic pain issues, let's wait until we have some of the lab results back first.  Next time you have a diabetic eye exam, please ask your optometrist or ophthalmologist to forward results to our office.   Please let us know if there is anything else we can do for you!

## 2015-09-22 LAB — CBC WITH DIFFERENTIAL/PLATELET
BASOS ABS: 0 {cells}/uL (ref 0–200)
Basophils Relative: 0 %
Eosinophils Absolute: 65 cells/uL (ref 15–500)
Eosinophils Relative: 1 %
HCT: 33 % — ABNORMAL LOW (ref 35.0–45.0)
HEMOGLOBIN: 10.8 g/dL — AB (ref 11.7–15.5)
LYMPHS ABS: 1885 {cells}/uL (ref 850–3900)
Lymphocytes Relative: 29 %
MCH: 29.7 pg (ref 27.0–33.0)
MCHC: 32.7 g/dL (ref 32.0–36.0)
MCV: 90.7 fL (ref 80.0–100.0)
MPV: 9.4 fL (ref 7.5–12.5)
Monocytes Absolute: 325 cells/uL (ref 200–950)
Monocytes Relative: 5 %
NEUTROS ABS: 4225 {cells}/uL (ref 1500–7800)
Neutrophils Relative %: 65 %
Platelets: 343 10*3/uL (ref 140–400)
RBC: 3.64 MIL/uL — ABNORMAL LOW (ref 3.80–5.10)
RDW: 13 % (ref 11.0–15.0)
WBC: 6.5 10*3/uL (ref 3.8–10.8)

## 2015-09-22 LAB — IRON AND TIBC
%SAT: 26 % (ref 11–50)
Iron: 78 ug/dL (ref 40–190)
TIBC: 303 ug/dL (ref 250–450)
UIBC: 225 ug/dL (ref 125–400)

## 2015-09-22 LAB — T4, FREE: FREE T4: 0.9 ng/dL (ref 0.8–1.8)

## 2015-09-22 LAB — CK: Total CK: 61 U/L (ref 7–177)

## 2015-09-22 LAB — ANA: ANA: NEGATIVE

## 2015-09-22 LAB — TSH: TSH: 2.84 m[IU]/L

## 2015-09-22 LAB — RHEUMATOID FACTOR: Rhuematoid fact SerPl-aCnc: <10 IU/mL (ref <=14)

## 2015-09-22 LAB — RETICULOCYTES
ABS RETIC: 62900 {cells}/uL (ref 20000–80000)
RBC.: 3.7 MIL/uL — AB (ref 3.80–5.10)
Retic Ct Pct: 1.7 %

## 2015-09-22 LAB — HIGH SENSITIVITY CRP: CRP, High Sensitivity: 5.1 mg/L — ABNORMAL HIGH

## 2015-09-22 LAB — SEDIMENTATION RATE: Sed Rate: 42 mm/hr — ABNORMAL HIGH (ref 0–20)

## 2015-09-22 NOTE — Addendum Note (Signed)
Addended by: Deirdre Pippins on: 09/22/2015 08:24 AM   Modules accepted: Orders

## 2015-09-23 LAB — FERRITIN: Ferritin: 50 ng/mL (ref 10–154)

## 2015-11-12 ENCOUNTER — Encounter: Payer: Self-pay | Admitting: Emergency Medicine

## 2015-11-12 ENCOUNTER — Emergency Department (INDEPENDENT_AMBULATORY_CARE_PROVIDER_SITE_OTHER)
Admission: EM | Admit: 2015-11-12 | Discharge: 2015-11-12 | Disposition: A | Discharge status: Home / Self Care | Payer: 59 | Source: Home / Self Care | Attending: Family Medicine | Admitting: Family Medicine

## 2015-11-12 DIAGNOSIS — E162 Hypoglycemia, unspecified: Secondary | ICD-10-CM

## 2015-11-12 LAB — POCT CBC W AUTO DIFF (K'VILLE URGENT CARE)

## 2015-11-12 NOTE — Discharge Instructions (Signed)
Discontinue metformin for now.  Check fasting glucose daily, and random glucose periodically and record.  May take Zofran if needed for nausea.  If symptoms become significantly worse during the night or over the weekend, proceed to the local emergency room.

## 2015-11-12 NOTE — ED Triage Notes (Signed)
Patient reports intermittent nausea, fatigue, frequent stools with cramps since last week. Has been checking BGs which have been unusually low (37).

## 2015-11-12 NOTE — ED Provider Notes (Signed)
[facility]    CSN: [id number] Arrival date & time: 11/12/15  1237  First Provider Contact:  First MD Initiated Contact with Patient 11/12/15 1332        History   Chief Complaint Chief Complaint  Patient presents with  . Nausea  . Headache  . Dizziness    HPI Hannah Taylor is a 35 y.o. female.   Patient reports that during the past week she has had intermittent nausea, headache, fatigue, sweats, increased heart rate, frequent stools (not diarrhea).   She has checked her blood glucose when she feels these symptoms and discovered it to be low.  At 8am today with recurrence of symptoms her glucose was 36.  Her glucose then gradually increased to 176.  She is assymptomatic at present. She reports that she has been taking metformin '500mg'$  daily for about 6 months.   The history is provided by the patient.    Past Medical History:  Diagnosis Date  . Contraception management 12/30/2014   Depo Provera  . Diabetes mellitus without complication ([place])   . Essential hypertension 12/30/2014  . GERD (gastroesophageal reflux disease) 12/30/2014   On Prilosec 20 ER  . H/O mammogram 01/07/2015   Normal 09/18/10  . History of gestational diabetes 12/30/2014  . History of low back pain 12/30/2014  . History of uterine fibroid 12/30/2014   S/p removal  . Hypothyroidism 12/30/2014  . Irritable bowel syndrome (IBS)   . Migraine without aura 12/30/2014  . Preeclampsia 12/30/2014   History of   . PUD (peptic ulcer disease)   . Seasonal allergies 12/30/2014  . Vitamin D deficiency 12/30/2014    Patient Active Problem List   Diagnosis Date Noted  . Failure to attend appointment 07/28/2015  . Type 2 diabetes mellitus without complication, without long-term current use of insulin ([place]) 05/26/2015  . Obesity (BMI 30-39.9) 05/19/2015  . Chronic pain syndrome 02/05/2015  . Urticaria 02/05/2015  . Dyspnea 02/04/2015  . History of chest pain 01/07/2015  . History of fibromyalgia  01/07/2015  . H/O mammogram 01/07/2015  . Essential hypertension 12/30/2014  . Hypothyroidism 12/30/2014  . Stomach upset 12/30/2014  . History of low back pain 12/30/2014  . Migraine without aura 12/30/2014  . Seasonal allergies 12/30/2014  . Vitamin D deficiency 12/30/2014  . GERD (gastroesophageal reflux disease) 12/30/2014  . History of bilateral breast reduction surgery 12/30/2014  . Contraception management 12/30/2014  . History of uterine fibroid 12/30/2014  . History of gestational diabetes 12/30/2014  . Preeclampsia 12/30/2014  . Palpitations 12/30/2014  . Chronic pain disorder 12/30/2014  . Irritable bowel syndrome (IBS) 12/30/2014    Past Surgical History:  Procedure Laterality Date  . BREAST REDUCTION SURGERY    . CESAREAN SECTION    . HERNIA REPAIR    . REPAIR OF PERFORATED ULCER    . ROBOTIC ASSISTED LAPAROSCOPIC VAGINAL HYSTERECTOMY WITH FIBROID REMOVAL      OB History    No data available       Home Medications    Prior to Admission medications   Medication Sig Start Date End Date Taking? Authorizing Provider  amitriptyline (ELAVIL) 75 MG tablet Take 1 tablet (75 mg total) by mouth at bedtime. 05/19/15   Emeterio Reeve, DO  bisacodyl (DULCOLAX) 10 MG suppository Place 1 suppository (10 mg total) rectally as needed for moderate constipation. 06/23/15   Kandra Nicolas, MD  blood glucose meter kit and supplies KIT Dispense based on patient and insurance preference. Use up  to four times daily as directed. Please include lancets, test strips, control solution. 05/26/15   Emeterio Reeve, DO  docusate sodium (COLACE) 50 MG capsule Take 1 capsule (50 mg total) by mouth 2 (two) times daily. 12/30/14   Emeterio Reeve, DO  lisinopril-hydrochlorothiazide (PRINZIDE,ZESTORETIC) 20-25 MG tablet Take 1 tablet by mouth daily. PLEASE KEEP FOLLOW UP APPOINTMENT 05/19/15   Emeterio Reeve, DO  medroxyPROGESTERone (DEPO-PROVERA) 150 MG/ML injection Inject 1 mL (150 mg  total) into the muscle every 3 (three) months. 02/24/15   Emeterio Reeve, DO  metFORMIN (GLUCOPHAGE XR) 500 MG 24 hr tablet Take 1 tablet (500 mg total) by mouth daily with breakfast. 05/26/15   Emeterio Reeve, DO  mineral oil enema Place 133 mLs (1 enema total) rectally once. 06/23/15   Kandra Nicolas, MD  naproxen (NAPROSYN) 500 MG tablet Take 500 mg by mouth as needed.    Historical Provider, MD  omeprazole (PRILOSEC) 20 MG capsule Take 1 capsule (20 mg total) by mouth 2 (two) times daily before a meal. 05/25/15   Emeterio Reeve, DO  ondansetron (ZOFRAN) 4 MG tablet Take 4 mg by mouth every 8 (eight) hours as needed for nausea or vomiting.    Historical Provider, MD  polyethylene glycol powder (GLYCOLAX/MIRALAX) powder Take 17 g by mouth daily. 06/23/15   Gregor Hams, MD  propranolol ER (INDERAL LA) 60 MG 24 hr capsule Take 1 capsule (60 mg total) by mouth daily. 05/19/15   Emeterio Reeve, DO  SYNTHROID 137 MCG tablet Take 1 tablet (137 mcg total) by mouth daily before breakfast. 06/24/15   Kandra Nicolas, MD    Family History Family History  Problem Relation Age of Onset  . Diabetes Mother   . Hypertension Mother   . Hyperlipidemia Father   . Hypertension Father   . Diabetes Maternal Grandmother   . Kidney failure Maternal Grandmother     Social History Social History  Substance Use Topics  . Smoking status: Never Smoker  . Smokeless tobacco: Never Used  . Alcohol use 0.0 oz/week     Comment: Occasional     Allergies   Aspirin; Iodinated diagnostic agents; and Iodine   Review of Systems Review of Systems  Constitutional: Positive for activity change, diaphoresis and fatigue. Negative for appetite change and fever.  Eyes: Negative.   Respiratory: Negative.   Cardiovascular: Positive for palpitations. Negative for chest pain and leg swelling.  Gastrointestinal: Negative for abdominal pain and diarrhea.       Frequent stools  Endocrine: Negative for polyuria.    Genitourinary: Negative.   Musculoskeletal: Negative.   Skin: Negative.   Neurological: Positive for headaches.  All other systems reviewed and are negative.    Physical Exam Triage Vital Signs ED Triage Vitals  Enc Vitals Group     BP 11/12/15 1303 109/76     Pulse Rate 11/12/15 1303 79     Resp 11/12/15 1303 16     Temp 11/12/15 1303 98.6 F (37 C)     Temp Source 11/12/15 1303 Oral     SpO2 11/12/15 1303 100 %     Weight 11/12/15 1305 168 lb (76.2 kg)     Height 11/12/15 1305 '5\' 3"'$  (1.6 m)     Head Circumference --      Peak Flow --      Pain Score 11/12/15 1306 0     Pain Loc --      Pain Edu? --      Excl.  in [place]? --    No data found.   Updated Vital Signs BP 109/76 (BP Location: Left Arm)   Pulse 79   Temp 98.6 F (37 C) (Oral)   Resp 16   Ht '5\' 3"'$  (1.6 m)   Wt 168 lb (76.2 kg)   SpO2 100%   BMI 29.76 kg/m   Visual Acuity Right Eye Distance:   Left Eye Distance:   Bilateral Distance:    Right Eye Near:   Left Eye Near:    Bilateral Near:     Physical Exam Nursing notes and Vital Signs reviewed. Appearance:  Patient appears stated age, and in no acute distress Eyes:  Pupils are equal, round, and reactive to light and accomodation.  Extraocular movement is intact.  Conjunctivae are not inflamed  Ears:  Canals normal.  Tympanic membranes normal.  Nose:   Normal turbinates.  No sinus tenderness.    Pharynx:  Normal; moist mucous membranes  Neck:  Supple.  No adenopathy Lungs:  Clear to auscultation.  Breath sounds are equal.  Moving air well. Heart:  Regular rate and rhythm without murmurs, rubs, or gallops.  Abdomen:  Nontender without masses or hepatosplenomegaly.  Bowel sounds are present.  No CVA or flank tenderness.  Extremities:  No edema.  Skin:  No rash present.    UC Treatments / Results  Labs (all labs ordered are listed, but only abnormal results are displayed) Labs Reviewed  POCT CBC W AUTO DIFF ([place]):  WBC 6.8; LY  35.3; MO 5.8; GR 58.9; Hgb 11.3; Platelets 334     EKG  EKG Interpretation None       Radiology No results found.  Procedures Procedures (including critical care time)  Medications Ordered in UC Medications - No data to display   Initial Impression / Assessment and Plan / UC Course  I have reviewed the triage vital signs and the nursing notes.  Pertinent labs & imaging results that were available during my care of the patient were reviewed by me and considered in my medical decision making (see chart for details).  Clinical Course  Review of records reveals improved control in patient's diabetes and hypothyroid:  Her Hgb A1C was 6.4 on 09/21/15 compared to 8.4 on 05/25/15.  Her TSH was 2.84 on 09/21/15, compared to 11.21, five months earlier. Discontinue metformin for now.  Check fasting glucose daily, and random glucose periodically and record.  May take Zofran if needed for nausea.  If symptoms become significantly worse during the night or over the weekend, proceed to the local emergency room.  Followup with Family Doctor in about one week.     Final Clinical Impressions(s) / UC Diagnoses   Final diagnoses:  Hypoglycemia    New Prescriptions New Prescriptions   No medications on file     Kandra Nicolas, MD 11/24/15 1038

## 2015-11-13 ENCOUNTER — Ambulatory Visit (INDEPENDENT_AMBULATORY_CARE_PROVIDER_SITE_OTHER): Payer: 59 | Admitting: Osteopathic Medicine

## 2015-11-13 VITALS — BP 108/76 | HR 70 | Wt 174.0 lb

## 2015-11-13 DIAGNOSIS — Z8669 Personal history of other diseases of the nervous system and sense organs: Secondary | ICD-10-CM | POA: Diagnosis not present

## 2015-11-13 DIAGNOSIS — E11649 Type 2 diabetes mellitus with hypoglycemia without coma: Secondary | ICD-10-CM

## 2015-11-13 DIAGNOSIS — R5382 Chronic fatigue, unspecified: Secondary | ICD-10-CM | POA: Diagnosis not present

## 2015-11-13 LAB — GLUCOSE, POCT (MANUAL RESULT ENTRY): POC GLUCOSE: 150 mg/dL — AB (ref 70–99)

## 2015-11-13 MED ORDER — SUMATRIPTAN SUCCINATE 50 MG PO TABS: 50.0000 mg | ORAL_TABLET | ORAL | 0 refills | Status: DC | PRN

## 2015-11-13 NOTE — Progress Notes (Signed)
HPI: Hannah Taylor is a 35 y.o. female  who presents to [place] today, 11/13/15,  for chief complaint of:  Chief Complaint  Patient presents with  . Hypoglycemia    . Context: seen in urgent care yesterday. Glc reading in the 30s at home - patient checked her sugars after feeling a bit nauseous/pain. Urgent care she states glucoses was 132. Was 169 this morning.  . Quality: nauseated, more frequent BM . Severity: Improved today but still present . Duration: About a week or so . Modifying factors: Only antidiabetic medication patient is on is metformin. She does have a previous history of migraine . Assoc signs/symptoms: headache not relieved with Tylenol      Past medical, surgical, social and family history reviewed: Past Medical History:  Diagnosis Date  . Contraception management 12/30/2014   Depo Provera  . Diabetes mellitus without complication ([place])   . Essential hypertension 12/30/2014  . GERD (gastroesophageal reflux disease) 12/30/2014   On Prilosec 20 ER  . H/O mammogram 01/07/2015   Normal 09/18/10  . History of gestational diabetes 12/30/2014  . History of low back pain 12/30/2014  . History of uterine fibroid 12/30/2014   S/p removal  . Hypothyroidism 12/30/2014  . Irritable bowel syndrome (IBS)   . Migraine without aura 12/30/2014  . Preeclampsia 12/30/2014   History of   . PUD (peptic ulcer disease)   . Seasonal allergies 12/30/2014  . Vitamin D deficiency 12/30/2014   Past Surgical History:  Procedure Laterality Date  . BREAST REDUCTION SURGERY    . CESAREAN SECTION    . HERNIA REPAIR    . REPAIR OF PERFORATED ULCER    . ROBOTIC ASSISTED LAPAROSCOPIC VAGINAL HYSTERECTOMY WITH FIBROID REMOVAL     Social History  Substance Use Topics  . Smoking status: Never Smoker  . Smokeless tobacco: Never Used  . Alcohol use 0.0 oz/week     Comment: Occasional   Family History  Problem Relation Age of Onset  . Diabetes Mother    . Hypertension Mother   . Hyperlipidemia Father   . Hypertension Father   . Diabetes Maternal Grandmother   . Kidney failure Maternal Grandmother      Current medication list and allergy/intolerance information reviewed:   Current Outpatient Prescriptions  Medication Sig Dispense Refill  . amitriptyline (ELAVIL) 75 MG tablet Take 1 tablet (75 mg total) by mouth at bedtime. 30 tablet 0  . bisacodyl (DULCOLAX) 10 MG suppository Place 1 suppository (10 mg total) rectally as needed for moderate constipation. 12 suppository 0  . blood glucose meter kit and supplies KIT Dispense based on patient and insurance preference. Use up to four times daily as directed. Please include lancets, test strips, control solution. 1 each 99  . docusate sodium (COLACE) 50 MG capsule Take 1 capsule (50 mg total) by mouth 2 (two) times daily. 10 capsule 0  . lisinopril-hydrochlorothiazide (PRINZIDE,ZESTORETIC) 20-25 MG tablet Take 1 tablet by mouth daily. PLEASE KEEP FOLLOW UP APPOINTMENT 90 tablet 1  . medroxyPROGESTERone (DEPO-PROVERA) 150 MG/ML injection Inject 1 mL (150 mg total) into the muscle every 3 (three) months. 1 mL 3  . metFORMIN (GLUCOPHAGE XR) 500 MG 24 hr tablet Take 1 tablet (500 mg total) by mouth daily with breakfast. 30 tablet 11  . mineral oil enema Place 133 mLs (1 enema total) rectally once. 133 mL 0  . naproxen (NAPROSYN) 500 MG tablet Take 500 mg by mouth as needed.    Marland Kitchen  omeprazole (PRILOSEC) 20 MG capsule Take 1 capsule (20 mg total) by mouth 2 (two) times daily before a meal. 60 capsule 1  . ondansetron (ZOFRAN) 4 MG tablet Take 4 mg by mouth every 8 (eight) hours as needed for nausea or vomiting.    . polyethylene glycol powder (GLYCOLAX/MIRALAX) powder Take 17 g by mouth daily. 850 g 1  . propranolol ER (INDERAL LA) 60 MG 24 hr capsule Take 1 capsule (60 mg total) by mouth daily. 90 capsule 1  . SYNTHROID 137 MCG tablet Take 1 tablet (137 mcg total) by mouth daily before breakfast. 30  tablet 1   No current facility-administered medications for this visit.    Allergies  Allergen Reactions  . Aspirin Other (See Comments)    Nose bleeds  . Iodinated Diagnostic Agents Swelling  . Iodine Swelling      Review of Systems:  Constitutional:  No  fever, no chills, +recent illness, No unintentional weight changes. +significant fatigue.   HEENT: +headache, no vision change, no hearing change, No sore throat, No  sinus pressure  Cardiac: No  chest pain, No  pressure  Respiratory:  No  shortness of breath  Gastrointestinal: No  abdominal pain, +nausea, No  vomiting,  No  blood in stool, No  diarrhea, No  constipation   Musculoskeletal: No new myalgia/arthralgia  Skin: No  Rash,   Endocrine: No polyuria/polydipsia/polyphagia   Neurologic: No  weakness, +dizziness, No  slurred speech/focal weakness/facial droop  Psychiatric: No  concerns with depression, No  concerns with anxiety  Exam:    Vitals:   11/13/15 0953  BP: 108/76  Pulse: 70   Constitutional: VS see above. General Appearance: alert, well-developed, well-nourished, NAD  Eyes: Normal lids and conjunctive, non-icteric sclera  Ears, Nose, Mouth, Throat: MMM, Normal external inspection ears/nares/mouth/lips/gums.   Neck: No masses, trachea midline. No thyroid enlargement. No tenderness/mass appreciated. No lymphadenopathy  Respiratory: Normal respiratory effort. no wheeze, no rhonchi, no rales  Cardiovascular: S1/S2 normal, no murmur, no rub/gallop auscultated. RRR. No lower extremity edema.  Gastrointestinal: Nontender, no masses.  Musculoskeletal: Gait normal. No clubbing/cyanosis of digits.   Neurological: Normal balance/coordination. No tremor.   Skin: warm, dry, intact. No rash/ulcer.   Psychiatric: Normal judgment/insight. Normal mood and affect. Oriented x3.    Results for orders placed or performed in visit on 11/13/15 (from the past 72 hour(s))  POCT Glucose (CBG)     Status:  Abnormal   Collection Time: 11/13/15  9:57 AM  Result Value Ref Range   POC Glucose 150 (A) 70 - 99 mg/dl      ASSESSMENT/PLAN: Not convinced of accuracy of home monitor giving her blood sugar reading in the 30s to be quite a dangerous low. Only diabetic medication she is on metformin, she has been taking the 500 mg twice per day, she can probably back off on this to once per day given good control on recent A1c. Other medication changes as noted below to avoid nausea/dizziness, could consider medication side effect as cause of her symptoms. Would recommend verification of home glucometer in the office if she is concerned, make sure supplies not expired. Blood pressure also bit on the low side today. Will discontinue low-dose beta blocker, not sure if she is taking this for migraine prophylaxis or not this was started by previous physician  Low blood sugar in diabetes ([place]) - Plan: POCT Glucose (CBG), CBC with Differential/Platelet, COMPLETE METABOLIC PANEL WITH GFR, Hemoglobin A1c, TSH, VITAMIN D 25 Hydroxy (Vit-D  Deficiency, Fractures)  Chronic fatigue - Plan: CBC with Differential/Platelet, TSH  History of migraine   Patient Instructions  Propranolol - stop taking this medicine for now Amitriptyline - cut in half, we can decrease the dose of this medicine Metformin - take only once per day  We will get labs today as well for further evaluation.  Based on results and your response to medication adjustments, and based on your symptoms, will determine further workup.   Plan to come back to the office for blood pressure follow-up and symptom recheck in 2 weeks, sooner if needed!     Visit summary with medication list and pertinent instructions was printed for patient to review. All questions at time of visit were answered - patient instructed to contact office with any additional concerns. ER/RTC precautions were reviewed with the patient. Follow-up plan: Return in about 2 weeks (around  11/27/2015) for BP followpu and sypmtom recheck (OV30).

## 2015-11-13 NOTE — Patient Instructions (Signed)
Propranolol - stop taking this medicine for now Amitriptyline - cut in half, we can decrease the dose of this medicine Metformin - take only once per day  We will get labs today as well for further evaluation.  Based on results and your response to medication adjustments, and based on your symptoms, will determine further workup.   Plan to come back to the office for blood pressure follow-up and symptom recheck in 2 weeks, sooner if needed!

## 2015-11-18 ENCOUNTER — Ambulatory Visit: Payer: 59 | Admitting: Osteopathic Medicine

## 2015-11-19 ENCOUNTER — Ambulatory Visit: Payer: BLUE CROSS/BLUE SHIELD | Admitting: Osteopathic Medicine

## 2015-11-30 ENCOUNTER — Ambulatory Visit: Payer: 59 | Admitting: Osteopathic Medicine

## 2015-12-01 ENCOUNTER — Ambulatory Visit (INDEPENDENT_AMBULATORY_CARE_PROVIDER_SITE_OTHER): Payer: 59 | Admitting: Osteopathic Medicine

## 2015-12-01 ENCOUNTER — Encounter: Payer: Self-pay | Admitting: Osteopathic Medicine

## 2015-12-01 VITALS — BP 135/83 | HR 86 | Ht 63.0 in | Wt 170.0 lb

## 2015-12-01 DIAGNOSIS — Z8619 Personal history of other infectious and parasitic diseases: Secondary | ICD-10-CM

## 2015-12-01 DIAGNOSIS — K219 Gastro-esophageal reflux disease without esophagitis: Secondary | ICD-10-CM | POA: Diagnosis not present

## 2015-12-01 DIAGNOSIS — E119 Type 2 diabetes mellitus without complications: Secondary | ICD-10-CM

## 2015-12-01 DIAGNOSIS — Z8739 Personal history of other diseases of the musculoskeletal system and connective tissue: Secondary | ICD-10-CM

## 2015-12-01 DIAGNOSIS — K589 Irritable bowel syndrome without diarrhea: Secondary | ICD-10-CM

## 2015-12-01 DIAGNOSIS — R5382 Chronic fatigue, unspecified: Secondary | ICD-10-CM | POA: Diagnosis not present

## 2015-12-01 NOTE — Patient Instructions (Signed)
As long as labs are looking good, you do not need to come back Thursday. I'm going to leave that appointment on the schedule for now though in case there are abnormal labs we need to talk about - get labs done today, and I'll call you tomorrow with results. Otherwise, can follow-up for routine diabetes check in 3 months, or sooner if needed for any other problems.

## 2015-12-02 ENCOUNTER — Telehealth: Payer: Self-pay

## 2015-12-02 LAB — CBC WITH DIFFERENTIAL/PLATELET
BASOS ABS: 0 {cells}/uL (ref 0–200)
Basophils Relative: 0 %
EOS PCT: 3 %
Eosinophils Absolute: 240 cells/uL (ref 15–500)
HCT: 36.4 % (ref 35.0–45.0)
Hemoglobin: 12 g/dL (ref 11.7–15.5)
LYMPHS ABS: 2720 {cells}/uL (ref 850–3900)
Lymphocytes Relative: 34 %
MCH: 29.5 pg (ref 27.0–33.0)
MCHC: 33 g/dL (ref 32.0–36.0)
MCV: 89.4 fL (ref 80.0–100.0)
MONOS PCT: 6 %
MPV: 8.8 fL (ref 7.5–12.5)
Monocytes Absolute: 480 cells/uL (ref 200–950)
NEUTROS PCT: 57 %
Neutro Abs: 4560 cells/uL (ref 1500–7800)
PLATELETS: 350 10*3/uL (ref 140–400)
RBC: 4.07 MIL/uL (ref 3.80–5.10)
RDW: 12.9 % (ref 11.0–15.0)
WBC: 8 10*3/uL (ref 3.8–10.8)

## 2015-12-02 LAB — COMPLETE METABOLIC PANEL WITH GFR
ALT: 14 U/L (ref 6–29)
AST: 15 U/L (ref 10–30)
Albumin: 4.5 g/dL (ref 3.6–5.1)
Alkaline Phosphatase: 56 U/L (ref 33–115)
BUN: 11 mg/dL (ref 7–25)
CHLORIDE: 100 mmol/L (ref 98–110)
CO2: 24 mmol/L (ref 20–31)
Calcium: 9.9 mg/dL (ref 8.6–10.2)
Creat: 0.82 mg/dL (ref 0.50–1.10)
GFR, Est African American: >89 mL/min (ref >=60)
GLUCOSE: 94 mg/dL (ref 65–99)
POTASSIUM: 4.1 mmol/L (ref 3.5–5.3)
SODIUM: 136 mmol/L (ref 135–146)
Total Bilirubin: 0.4 mg/dL (ref 0.2–1.2)
Total Protein: 7.5 g/dL (ref 6.1–8.1)

## 2015-12-02 LAB — TSH: TSH: 2.86 m[IU]/L

## 2015-12-02 LAB — HEMOGLOBIN A1C
Hgb A1c MFr Bld: 5.8 % — ABNORMAL HIGH (ref <5.7)
Mean Plasma Glucose: 120 mg/dL

## 2015-12-02 LAB — VITAMIN D 25 HYDROXY (VIT D DEFICIENCY, FRACTURES): VIT D 25 HYDROXY: 23 ng/mL — AB (ref 30–100)

## 2015-12-02 MED ORDER — FLUCONAZOLE 150 MG PO TABS: 150.0000 mg | ORAL_TABLET | Freq: Once | ORAL | 1 refills | Status: AC

## 2015-12-02 NOTE — Telephone Encounter (Signed)
Patient called stated that she was in the office on yesterday and she was supposed to get a Rx for Diflucan because she has been on antibiotics for 20 days. Patient request that Rx be sent to her pharmacy. Also she stated that you forgot to add a Dx of hypertension to her disability form she added it on hers but wanted to let you know so that it would be updated on copy for chart. Please advise. Rhonda Cunningham,CMA

## 2015-12-02 NOTE — Telephone Encounter (Signed)
Patient has been informed. Rhonda Cunningham,CMA  

## 2015-12-02 NOTE — Progress Notes (Signed)
HPI: Hannah Taylor is a 35 y.o. female  who presents to [place] today, 12/02/15,  for chief complaint of:  Chief Complaint  Patient presents with  . PAPERWORK    FMLA    Patient recently out of work for several days at a time due to initial concern for low blood sugar and then developed sinusitis which was treated by the emergency department. Patient is here to have paperwork filled out stating that she experienced significant illness enough to keep her out of work greater than the typical sick-day allowance.  Patient also due for labs to recheck on A1c, follow-up on A1c since and blood pressure recheck since discontinuing the propranolol. She has no further episodes of hypoglycemia/dizziness.   Past medical, surgical, social and family history reviewed: Past Medical History:  Diagnosis Date  . Contraception management 12/30/2014   Depo Provera  . Diabetes mellitus without complication ([place])   . Essential hypertension 12/30/2014  . GERD (gastroesophageal reflux disease) 12/30/2014   On Prilosec 20 ER  . H/O mammogram 01/07/2015   Normal 09/18/10  . History of gestational diabetes 12/30/2014  . History of low back pain 12/30/2014  . History of uterine fibroid 12/30/2014   S/p removal  . Hypothyroidism 12/30/2014  . Irritable bowel syndrome (IBS)   . Migraine without aura 12/30/2014  . Preeclampsia 12/30/2014   History of   . PUD (peptic ulcer disease)   . Seasonal allergies 12/30/2014  . Vitamin D deficiency 12/30/2014   Past Surgical History:  Procedure Laterality Date  . BREAST REDUCTION SURGERY    . CESAREAN SECTION    . HERNIA REPAIR    . REPAIR OF PERFORATED ULCER    . ROBOTIC ASSISTED LAPAROSCOPIC VAGINAL HYSTERECTOMY WITH FIBROID REMOVAL     Social History  Substance Use Topics  . Smoking status: Never Smoker  . Smokeless tobacco: Never Used  . Alcohol use 0.0 oz/week     Comment: Occasional   Family History  Problem Relation Age  of Onset  . Diabetes Mother   . Hypertension Mother   . Hyperlipidemia Father   . Hypertension Father   . Diabetes Maternal Grandmother   . Kidney failure Maternal Grandmother      Current medication list and allergy/intolerance information reviewed:   Current Outpatient Prescriptions  Medication Sig Dispense Refill  . amitriptyline (ELAVIL) 75 MG tablet Take 1 tablet (75 mg total) by mouth at bedtime. 30 tablet 0  . bisacodyl (DULCOLAX) 10 MG suppository Place 1 suppository (10 mg total) rectally as needed for moderate constipation. 12 suppository 0  . blood glucose meter kit and supplies KIT Dispense based on patient and insurance preference. Use up to four times daily as directed. Please include lancets, test strips, control solution. 1 each 99  . docusate sodium (COLACE) 50 MG capsule Take 1 capsule (50 mg total) by mouth 2 (two) times daily. 10 capsule 0  . lisinopril-hydrochlorothiazide (PRINZIDE,ZESTORETIC) 20-25 MG tablet Take 1 tablet by mouth daily. PLEASE KEEP FOLLOW UP APPOINTMENT 90 tablet 1  . medroxyPROGESTERone (DEPO-PROVERA) 150 MG/ML injection Inject 1 mL (150 mg total) into the muscle every 3 (three) months. 1 mL 3  . metFORMIN (GLUCOPHAGE XR) 500 MG 24 hr tablet Take 1 tablet (500 mg total) by mouth daily with breakfast. 30 tablet 11  . mineral oil enema Place 133 mLs (1 enema total) rectally once. 133 mL 0  . naproxen (NAPROSYN) 500 MG tablet Take 500 mg by mouth as needed.    Marland Kitchen  omeprazole (PRILOSEC) 20 MG capsule Take 1 capsule (20 mg total) by mouth 2 (two) times daily before a meal. 60 capsule 1  . ondansetron (ZOFRAN) 4 MG tablet Take 4 mg by mouth every 8 (eight) hours as needed for nausea or vomiting.    . polyethylene glycol powder (GLYCOLAX/MIRALAX) powder Take 17 g by mouth daily. 850 g 1  . propranolol ER (INDERAL LA) 60 MG 24 hr capsule Take 1 capsule (60 mg total) by mouth daily. 90 capsule 1  . SUMAtriptan (IMITREX) 50 MG tablet Take 1 tablet (50 mg total)  by mouth every 2 (two) hours as needed for migraine. May repeat in 2 hours if headache persists or recurs. Max 4 pills per day, max 2 days per week 30 tablet 0  . SYNTHROID 137 MCG tablet Take 1 tablet (137 mcg total) by mouth daily before breakfast. 30 tablet 1   No current facility-administered medications for this visit.    Allergies  Allergen Reactions  . Aspirin Other (See Comments)    Nose bleeds  . Iodinated Diagnostic Agents Swelling  . Iodine Swelling      Review of Systems:  Constitutional:  No  fever, no chills, +recent illness, No unintentional weight changes. No significant fatigue.   HEENT: No  headache, no vision change  Cardiac: No  chest pain, No  pressure  Respiratory:  No  shortness of breath.  Gastrointestinal: No  abdominal pain, No  nausea  Musculoskeletal: No new myalgia/arthralgia  Neurologic: No  weakness, No  dizziness  Exam:  BP 135/83   Pulse 86   Ht _0  (1.6 m)   Wt 170 lb (77.1 kg)   BMI 30.11 kg/m   Constitutional: VS see above. General Appearance: alert, well-developed, well-nourished, NAD  Neck: No masses, trachea midline.   Respiratory: Normal respiratory effort. no wheeze, no rhonchi, no rales  Cardiovascular: S1/S2 normal, no murmur, no rub/gallop auscultated. RRR.  Psychiatric: Normal judgment/insight. Normal mood and affect. Oriented x3.    Results for orders placed or performed in visit on 11/13/15 (from the past 72 hour(s))  CBC with Differential/Platelet     Status: None   Collection Time: 12/01/15  4:01 PM  Result Value Ref Range   WBC 8.0 3.8 - 10.8 K/uL   RBC 4.07 3.80 - 5.10 MIL/uL   Hemoglobin 12.0 11.7 - 15.5 g/dL   HCT 36.4 35.0 - 45.0 %   MCV 89.4 80.0 - 100.0 fL   MCH 29.5 27.0 - 33.0 pg   MCHC 33.0 32.0 - 36.0 g/dL   RDW 12.9 11.0 - 15.0 %   Platelets 350 140 - 400 K/uL   MPV 8.8 7.5 - 12.5 fL   Neutro Abs 4,560 1,500 - 7,800 cells/uL   Lymphs Abs 2,720 850 - 3,900 cells/uL   Monocytes Absolute 480  200 - 950 cells/uL   Eosinophils Absolute 240 15 - 500 cells/uL   Basophils Absolute 0 0 - 200 cells/uL   Neutrophils Relative % 57 %   Lymphocytes Relative 34 %   Monocytes Relative 6 %   Eosinophils Relative 3 %   Basophils Relative 0 %   Smear Review Criteria for review not met   COMPLETE METABOLIC PANEL WITH GFR     Status: None   Collection Time: 12/01/15  4:01 PM  Result Value Ref Range   Sodium 136 135 - 146 mmol/L   Potassium 4.1 3.5 - 5.3 mmol/L   Chloride 100 98 - 110 mmol/L  CO2 24 20 - 31 mmol/L   Glucose, Bld 94 65 - 99 mg/dL   BUN 11 7 - 25 mg/dL   Creat 0.82 0.50 - 1.10 mg/dL   Total Bilirubin 0.4 0.2 - 1.2 mg/dL   Alkaline Phosphatase 56 33 - 115 U/L   AST 15 10 - 30 U/L   ALT 14 6 - 29 U/L   Total Protein 7.5 6.1 - 8.1 g/dL   Albumin 4.5 3.6 - 5.1 g/dL   Calcium 9.9 8.6 - 10.2 mg/dL   GFR, Est African American >89 >=60 mL/min   GFR, Est Non African American >89 >=60 mL/min  Hemoglobin A1c     Status: Abnormal   Collection Time: 12/01/15  4:01 PM  Result Value Ref Range   Hgb A1c MFr Bld 5.8 (H) <5.7 %    Comment:   For someone without known diabetes, a hemoglobin A1c value between 5.7% and 6.4% is consistent with prediabetes and should be confirmed with a follow-up test.   For someone with known diabetes, a value <7% indicates that their diabetes is well controlled. A1c targets should be individualized based on duration of diabetes, age, co-morbid conditions and other considerations.   This assay result is consistent with an increased risk of diabetes.   Currently, no consensus exists regarding use of hemoglobin A1c for diagnosis of diabetes in children.      Mean Plasma Glucose 120 mg/dL  TSH     Status: None   Collection Time: 12/01/15  4:01 PM  Result Value Ref Range   TSH 2.86 mIU/L    Comment:   Reference Range   > or = 20 Years  0.40-4.50   Pregnancy Range First trimester  0.26-2.66 Second trimester 0.55-2.73 Third trimester   0.43-2.91     VITAMIN D 25 Hydroxy (Vit-D Deficiency, Fractures)     Status: Abnormal   Collection Time: 12/01/15  4:01 PM  Result Value Ref Range   Vit D, 25-Hydroxy 23 (L) 30 - 100 ng/mL    Comment: Vitamin D Status           25-OH Vitamin D        Deficiency                <20 ng/mL        Insufficiency         20 - 29 ng/mL        Optimal             > or = 30 ng/mL   For 25-OH Vitamin D testing on patients on D2-supplementation and patients for whom quantitation of D2 and D3 fractions is required, the Quest[employer]D 25-OH VIT D, (D2,D3), LC/MS/MS is recommended: order code [phone] (patients > 2 yrs).       ASSESSMENT/PLAN: Forms filled out for short-term absence for viral illness, intermittent absence for complications which may arise due to type 2 diabetes, irritable bowel, fibromyalgia  Hx of viral illness - Forms completed for missing work, if anything else needed to be know. See scanned documents  Chronic fatigue  Type 2 diabetes mellitus without complication, without long-term current use of insulin (HCC)  Gastroesophageal reflux disease, esophagitis presence not specified  Irritable bowel syndrome, unspecified type  History of fibromyalgia     Visit summary with medication list and pertinent instructions was printed for patient to review. All questions at time of visit were answered - patient instructed to contact office with any additional concerns. ER/RTC precautions were  reviewed with the patient. Follow-up plan: Return in about 3 months (around 03/02/2016) for [place], sooner if needed.  Note: Total time spent 25 minutes, greater than 50% of the visit was spent face-to-face counseling and coordinating care for the following: The primary encounter diagnosis was Hx of viral illness. Diagnoses of Chronic fatigue, Type 2 diabetes mellitus without complication, without long-term current use of insulin ([place]), Gastroesophageal reflux disease, esophagitis  presence not specified, Irritable bowel syndrome, unspecified type, and History of fibromyalgia were also pertinent to this visit.Marland Kitchen

## 2015-12-02 NOTE — Telephone Encounter (Signed)
Okay, I sent in the request for Diflucan. Pharmacy should have called her when it was ready. Apologies I forgot to include hypertension on her list for work form, it is on her chart here already. Thanks!

## 2015-12-03 ENCOUNTER — Ambulatory Visit: Payer: 59 | Admitting: Osteopathic Medicine

## 2015-12-22 ENCOUNTER — Ambulatory Visit: Payer: Medicaid Other | Admitting: Osteopathic Medicine

## 2015-12-25 ENCOUNTER — Other Ambulatory Visit: Payer: Self-pay | Admitting: Osteopathic Medicine

## 2015-12-25 DIAGNOSIS — I10 Essential (primary) hypertension: Secondary | ICD-10-CM

## 2016-01-02 ENCOUNTER — Other Ambulatory Visit: Payer: Self-pay | Admitting: Osteopathic Medicine

## 2016-01-21 ENCOUNTER — Other Ambulatory Visit: Payer: Self-pay | Admitting: Osteopathic Medicine

## 2016-01-21 DIAGNOSIS — I10 Essential (primary) hypertension: Secondary | ICD-10-CM

## 2016-01-29 ENCOUNTER — Other Ambulatory Visit: Payer: Self-pay | Admitting: *Deleted

## 2016-01-29 MED ORDER — LEVOTHYROXINE SODIUM 125 MCG PO TABS: ORAL_TABLET | ORAL | 3 refills | Status: DC

## 2016-02-02 ENCOUNTER — Other Ambulatory Visit: Payer: Self-pay | Admitting: Osteopathic Medicine

## 2016-02-02 ENCOUNTER — Telehealth: Payer: Self-pay

## 2016-02-02 MED ORDER — DULOXETINE HCL 30 MG PO CPEP: 30.0000 mg | ORAL_CAPSULE | Freq: Every day | ORAL | 3 refills | Status: DC

## 2016-02-02 NOTE — Telephone Encounter (Signed)
Please call patient: Can let her know I sent in Cymbalta at 30 mg dose, can try this for 2-4 weeks and if doing well continue at that dose or have option at that point to go up to 60 mg dose. She can continue the amitriptyline or we can stop it altogether. Combination of medication may be helpful. Have her let us know if she would like me to take the amitriptyline off of her medication list.

## 2016-02-02 NOTE — Telephone Encounter (Signed)
Sounds good

## 2016-02-02 NOTE — Telephone Encounter (Addendum)
Patient called stated that Amitriptyline is not working for  her  fibroyalgia pain and she did not do too well on the Lyrica. Patient stated that she was on low dose Cymbalta in the past and she is requesting a Rx for Cymbalta. Please advise patient [facility]. Rhonda Cunningham,CMA

## 2016-02-02 NOTE — Telephone Encounter (Signed)
Spoke to patient she stated that she will try the Amitriptyline and Cymbalta together and if it does not work she will call back. Channell Quattrone,CMA

## 2016-03-07 ENCOUNTER — Other Ambulatory Visit: Payer: Self-pay | Admitting: Osteopathic Medicine

## 2016-05-10 ENCOUNTER — Other Ambulatory Visit: Payer: Self-pay | Admitting: Osteopathic Medicine

## 2016-05-10 DIAGNOSIS — I10 Essential (primary) hypertension: Secondary | ICD-10-CM

## 2016-05-25 ENCOUNTER — Other Ambulatory Visit: Payer: Self-pay | Admitting: Osteopathic Medicine

## 2016-06-27 ENCOUNTER — Other Ambulatory Visit: Payer: Self-pay | Admitting: Osteopathic Medicine

## 2016-06-27 DIAGNOSIS — E119 Type 2 diabetes mellitus without complications: Secondary | ICD-10-CM

## 2016-08-08 ENCOUNTER — Other Ambulatory Visit: Payer: Self-pay | Admitting: Osteopathic Medicine

## 2016-08-08 DIAGNOSIS — E119 Type 2 diabetes mellitus without complications: Secondary | ICD-10-CM

## 2016-09-10 ENCOUNTER — Other Ambulatory Visit: Payer: Self-pay | Admitting: Osteopathic Medicine

## 2016-09-10 DIAGNOSIS — I10 Essential (primary) hypertension: Secondary | ICD-10-CM

## 2017-01-16 ENCOUNTER — Ambulatory Visit (INDEPENDENT_AMBULATORY_CARE_PROVIDER_SITE_OTHER): Payer: Managed Care, Other (non HMO) | Admitting: Neurology

## 2017-01-16 ENCOUNTER — Encounter: Payer: Self-pay | Admitting: Neurology

## 2017-01-16 VITALS — BP 120/76 | HR 79 | Ht 63.0 in | Wt 161.4 lb

## 2017-01-16 DIAGNOSIS — R200 Anesthesia of skin: Secondary | ICD-10-CM

## 2017-01-16 DIAGNOSIS — R531 Weakness: Secondary | ICD-10-CM | POA: Diagnosis not present

## 2017-01-16 DIAGNOSIS — M797 Fibromyalgia: Secondary | ICD-10-CM

## 2017-01-16 DIAGNOSIS — R202 Paresthesia of skin: Secondary | ICD-10-CM | POA: Diagnosis not present

## 2017-01-16 DIAGNOSIS — M542 Cervicalgia: Secondary | ICD-10-CM | POA: Diagnosis not present

## 2017-01-16 DIAGNOSIS — G629 Polyneuropathy, unspecified: Secondary | ICD-10-CM

## 2017-01-16 DIAGNOSIS — G35 Multiple sclerosis: Secondary | ICD-10-CM | POA: Diagnosis not present

## 2017-01-16 NOTE — Patient Instructions (Signed)
Remember to drink plenty of fluid, eat healthy meals and do not skip any meals. Try to eat protein with a every meal and eat a healthy snack such as fruit or nuts in between meals. Try to keep a regular sleep-wake schedule and try to exercise daily, particularly in the form of walking, 20-30 minutes a day, if you can.   As far as diagnostic testing: EMG/NCS, MRI brain and cervical spine, labs  I would like to see you back for emg/ncs, sooner if we need to. Please call us with any interim questions, concerns, problems, updates or refill requests.   Our phone number is [phone]. We also have an after hours call service for urgent matters and there is a physician on-call for urgent questions. For any emergencies you know to call 911 or go to the nearest emergency room

## 2017-01-16 NOTE — Progress Notes (Signed)
[facility]    Provider:  Dr Jaynee Eagles Referring Provider: Emeterio Reeve, DO, Secundino Ginger, PA-C Primary Care Physician:  Secundino Ginger, PA-C  CC:  Low back pain  HPI:  Hannah Taylor is a 36 y.o. female here as a referral from Dr. Sheppard Coil for low back pain. PMHx Fibromyalgia, migraine, DM2 controlled, hypothyroidism. Has tried Amitriptyline and Lyrica. Also Cymbalta. For years she has neck and back pain. Migraines have resolved. She has taken steroids. Steroids helped. She has pain in the back and hands and arms and legs, shooting and tingling pain, can flair up, numbness in her hands and feet, she will lose feeling in her legs and arms, ahe has pain on the right side near the buttocks that comes and goes. "Sparks of pain". More fatigue than weakness. Also 'sparks" in her head. Weakness in the lands. Pain can be exacerbated y moving her arm. Weakness in her hands. She has constipation but that is chronic. Symptoms for years, worsening, daily and frequent, can be moderately painful, numb hands and feet. No changes in bowel bldder. She has episodes of diplopia and blurry vision. Lots of fatigue.  Reviewed notes, labs and imaging from outside physicians, which showed:  HgbA1c 5.8, TSH normal, ANA neg, CK normal, RF neg  CT Abdomen Pelvis: IMPRESSION: reviewed images and agree with the following: 1. No acute abdominal or pelvic pathology. 2. Large amount of stool throughout the colon.  CT head 2014: Report only   IMPRESSION:  1. No acute intracranial abnormality identified. 2. Acute sinonasal disease.   XR lumbar spine 2-3 views and c-spine: report only  [facility] Normal cervical spine, normal lumbar spine  Review of Systems: Patient complains of symptoms per HPI as well as the following symptoms: numbness and tingling. Pertinent negatives and positives per HPI. All others negative.   Social History   Socioeconomic History  . Marital  status: Single    Spouse name: Not on file  . Number of children: 1  . Years of education: Not on file  . Highest education level: Not on file  Social Needs  . Financial resource strain: Not on file  . Food insecurity - worry: Not on file  . Food insecurity - inability: Not on file  . Transportation needs - medical: Not on file  . Transportation needs - non-medical: Not on file  Occupational History  . Not on file  Tobacco Use  . Smoking status: Never Smoker  . Smokeless tobacco: Never Used  Substance and Sexual Activity  . Alcohol use: Yes    Alcohol/week: 0.0 oz    Comment: Occasional  . Drug use: No  . Sexual activity: Not on file  Other Topics Concern  . Not on file  Social History Narrative  . Not on file    Family History  Problem Relation Age of Onset  . Diabetes Mother   . Hypertension Mother   . Hyperlipidemia Father   . Hypertension Father   . Diabetes Maternal Grandmother   . Kidney failure Maternal Grandmother   . Multiple sclerosis Neg Hx     Past Medical History:  Diagnosis Date  . Contraception management 12/30/2014   Depo Provera  . Diabetes mellitus without complication ([place])   . Essential hypertension 12/30/2014  . GERD (gastroesophageal reflux disease) 12/30/2014   On Prilosec 20 ER  . H/O mammogram 01/07/2015   Normal 09/18/10  . History of gestational diabetes 12/30/2014  . History of low back pain 12/30/2014  .  History of uterine fibroid 12/30/2014   S/p removal  . Hypothyroidism 12/30/2014  . Irritable bowel syndrome (IBS)   . Migraine without aura 12/30/2014  . Preeclampsia 12/30/2014   History of   . PUD (peptic ulcer disease)   . Seasonal allergies 12/30/2014  . Vitamin D deficiency 12/30/2014    Past Surgical History:  Procedure Laterality Date  . BREAST REDUCTION SURGERY    . CESAREAN SECTION    . HERNIA REPAIR    . REPAIR OF PERFORATED ULCER    . ROBOTIC ASSISTED LAPAROSCOPIC VAGINAL HYSTERECTOMY WITH FIBROID REMOVAL       Current Outpatient Medications  Medication Sig Dispense Refill  . blood glucose meter kit and supplies KIT Dispense based on patient and insurance preference. Use up to four times daily as directed. Please include lancets, test strips, control solution. 1 each 99  . docusate sodium (COLACE) 50 MG capsule Take 1 capsule (50 mg total) by mouth 2 (two) times daily. 10 capsule 0  . levothyroxine (SYNTHROID, LEVOTHROID) 125 MCG tablet TAKE 1 TABLET BY MOUTH EVERY DAY BEFORE BREAKFAST 30 tablet 0  . lisinopril-hydrochlorothiazide (PRINZIDE,ZESTORETIC) 20-25 MG tablet Take 1 tablet by mouth daily. Due for follow up visit 30 tablet 0  . LYRICA 75 MG capsule Take 75 mg by mouth 2 (two) times daily.  2  . MedroxyPROGESTERone Acetate 150 MG/ML SUSY INJECT IM EVERY 3 MONTHS 1 Syringe 0  . metFORMIN (GLUCOPHAGE-XR) 500 MG 24 hr tablet Take 1 tablet (500 mg total) by mouth daily with breakfast. Due for follow up visit 30 tablet 0  . ondansetron (ZOFRAN) 4 MG tablet Take 4 mg by mouth every 8 (eight) hours as needed for nausea or vomiting.    . polyethylene glycol powder (GLYCOLAX/MIRALAX) powder Take 17 g by mouth daily. 850 g 1  . topiramate (TOPAMAX) 50 MG tablet TAKE ONE TABLET (50 MG TOTAL) BY MOUTH 1 (ONE) HOUR BEFORE BEDTIME.  1  . omeprazole (PRILOSEC) 20 MG capsule Take 1 capsule (20 mg total) by mouth 2 (two) times daily before a meal. (Patient not taking: Reported on 01/16/2017) 60 capsule 1   No current facility-administered medications for this visit.     Allergies as of 01/16/2017 - Review Complete 01/16/2017  Allergen Reaction Noted  . Hydrocodone Itching and Hives 11/15/2015  . Aspirin Other (See Comments) 12/30/2014  . Iodinated diagnostic agents Swelling 02/04/2015  . Iodine Swelling 12/30/2014  . Metrizamide Swelling 10/01/2014    Vitals: BP 120/76 (BP Location: Right Arm, Patient Position: Sitting, Cuff Size: Small)   Pulse 79   Ht '5\' 3"'$  (1.6 m)   Wt 161 lb 6.4 oz (73.2 kg)    BMI 28.59 kg/m  Last Weight:  Wt Readings from Last 1 Encounters:  01/16/17 161 lb 6.4 oz (73.2 kg)   Last Height:   Ht Readings from Last 1 Encounters:  01/16/17 '5\' 3"'$  (1.6 m)    Physical exam: Exam: Gen: NAD, conversant, well nourised, obese, well groomed                     CV: RRR, no MRG. No Carotid Bruits. No peripheral edema, warm, nontender Eyes: Conjunctivae clear without exudates or hemorrhage  Neuro: Detailed Neurologic Exam  Speech:    Speech is normal; fluent and spontaneous with normal comprehension.  Cognition:    The patient is oriented to person, place, and time;     recent and remote memory intact;     language fluent;  normal attention, concentration,     fund of knowledge Cranial Nerves:    The pupils are equal, round, and reactive to light. The fundi are normal and spontaneous venous pulsations are present. Visual fields are full to finger confrontation. Extraocular movements are intact. Trigeminal sensation is intact and the muscles of mastication are normal. The face is symmetric. The palate elevates in the midline. Hearing intact. Voice is normal. Shoulder shrug is normal. The tongue has normal motion without fasciculations.   Coordination:    Normal finger to nose and heel to shin. Normal rapid alternating movements.   Gait:    Heel-toe and tandem gait are normal.   Motor Observation:    No asymmetry, no atrophy, and no involuntary movements noted. Tone:    Normal muscle tone.    Posture:    Posture is normal. normal erect    Strength: right LE generalized weakness 4=4+/5, otherwise strength is V/V in the upper and lower limbs.      Sensation: intact to LT     Reflex Exam:  DTR's:    Deep tendon reflexes in the upper and lower extremities are normal bilaterally.   Toes:    The toes are downgoing bilaterally.   Clonus:    Clonus is absent.      Assessment/Plan:  36 y.o. female here as a referral from Dr. Sheppard Coil for low back  pain. PMHx Fibromyalgia, migraine, DM2 controlled, hypothyroidism. She reports paresthesias in all limbs and face, focal weakness, numbness in the hands and feet and paresthesias. Worsening, daily. Also with diplopia, worsening vision.  Need to evaluate for MS, other lesions, cervical spine abnormalities  given symptoms above as well as focal weakness EMG/NCS right leg and bilateral uppers for peripheral nerve disease Labs today  Orders Placed This Encounter  Procedures  . MR BRAIN W WO CONTRAST  . MR CERVICAL SPINE WO CONTRAST  . B12 and Folate Panel  . Methylmalonic acid, serum  . Sjogren's syndrome antibods(ssa + ssb)  . Vitamin B1  . Vitamin B6  . NCV with EMG(electromyography)    Cc: Secundino Ginger, PA-C  Sarina Ill, MD  [facility] [address] [place], [place] [address]  Phone [phone] Fax [phone]

## 2017-01-19 ENCOUNTER — Telehealth: Payer: Self-pay | Admitting: *Deleted

## 2017-01-19 LAB — VITAMIN B6: Vitamin B6: 15 ug/L (ref 2.0–32.8)

## 2017-01-19 LAB — METHYLMALONIC ACID, SERUM: METHYLMALONIC ACID: 79 nmol/L (ref 0–378)

## 2017-01-19 LAB — B12 AND FOLATE PANEL
FOLATE: 3.6 ng/mL (ref >3.0)
Vitamin B-12: 434 pg/mL (ref 232–1245)

## 2017-01-19 LAB — SJOGREN'S SYNDROME ANTIBODS(SSA + SSB)

## 2017-01-19 LAB — VITAMIN B1: Thiamine: 87.8 nmol/L (ref 66.5–200.0)

## 2017-01-19 NOTE — Telephone Encounter (Signed)
-----   Message from Anson Fret, MD sent at 01/19/2017 11:19 AM EST ----- Labs all normal

## 2017-01-19 NOTE — Telephone Encounter (Signed)
Called and LVM asking for call back. Unable to leave details. If she calls back, please tell her that her labs are all normal.

## 2017-01-20 ENCOUNTER — Ambulatory Visit: Payer: Managed Care, Other (non HMO) | Admitting: Family Medicine

## 2017-01-23 NOTE — Telephone Encounter (Signed)
Called and LVM asking for call back. When she calls back, please tell her that her labs are normal. [place].

## 2017-01-23 NOTE — Telephone Encounter (Signed)
Advised patient of normal labs

## 2017-01-26 ENCOUNTER — Telehealth: Payer: Self-pay | Admitting: Neurology

## 2017-01-26 NOTE — Telephone Encounter (Signed)
Noted, thank you

## 2017-01-26 NOTE — Telephone Encounter (Signed)
Ordered. thanks

## 2017-01-26 NOTE — Telephone Encounter (Signed)
Rosann Auerbach did not approve the MRI Cervical wo contrast but if you switch the order to a MRI Cervical w/wo contrast they will approve it. When you get a chance can you put a new order in. She is scheduled for this Saturday 01/28/17 at [facility].

## 2017-01-26 NOTE — Telephone Encounter (Signed)
Okay disregard this. Rosann Auerbach came back and approved the Cervical wo contrast. She is going to proceed with have the MRI Cervical spine wo contrast at [facility].

## 2017-01-26 NOTE — Telephone Encounter (Signed)
Ok per Dr. Lucia Gaskins

## 2017-01-26 NOTE — Addendum Note (Signed)
Addended by: Naomie Dean B on: 01/26/2017 12:22 PM   Modules accepted: Orders

## 2017-01-28 ENCOUNTER — Inpatient Hospital Stay: Admission: RE | Admit: 2017-01-28 | Payer: Medicaid Other | Source: Ambulatory Visit

## 2017-01-28 ENCOUNTER — Other Ambulatory Visit: Payer: Medicaid Other

## 2017-02-07 ENCOUNTER — Telehealth: Payer: Self-pay | Admitting: Neurology

## 2017-02-07 NOTE — Telephone Encounter (Signed)
Patient had MRI on 02/06/17 at [facility].

## 2017-02-08 ENCOUNTER — Ambulatory Visit (INDEPENDENT_AMBULATORY_CARE_PROVIDER_SITE_OTHER): Payer: Managed Care, Other (non HMO) | Admitting: Neurology

## 2017-02-08 DIAGNOSIS — Z0289 Encounter for other administrative examinations: Secondary | ICD-10-CM

## 2017-02-08 DIAGNOSIS — M542 Cervicalgia: Secondary | ICD-10-CM

## 2017-02-08 DIAGNOSIS — R202 Paresthesia of skin: Secondary | ICD-10-CM | POA: Diagnosis not present

## 2017-02-08 DIAGNOSIS — G629 Polyneuropathy, unspecified: Secondary | ICD-10-CM | POA: Diagnosis not present

## 2017-02-08 DIAGNOSIS — R2990 Unspecified symptoms and signs involving the nervous system: Secondary | ICD-10-CM | POA: Diagnosis not present

## 2017-02-08 DIAGNOSIS — G35 Multiple sclerosis: Secondary | ICD-10-CM

## 2017-02-08 DIAGNOSIS — R531 Weakness: Secondary | ICD-10-CM

## 2017-02-08 DIAGNOSIS — R200 Anesthesia of skin: Secondary | ICD-10-CM | POA: Diagnosis not present

## 2017-02-08 NOTE — Progress Notes (Signed)
See procedure note.

## 2017-02-08 NOTE — Procedures (Signed)
Full Name: Hannah Taylor Gender: Female MRN #: [id number] Date of Birth: 1980/10/21    Visit Date: 02/08/2017 09:38 Age: 36 Years 8 Months Old Examining Physician: Naomie Dean, MD  Referring Physician: Sunnie Nielsen, DO, Roxanne Mins   History: Hannah Taylor is a 36 y.o. female with a PMHx Fibromyalgia, migraine, DM2 controlled, hypothyroidism.  Patient presented to neurology clinic with multiple neurologic complaints including chronic pain in the back, neck and all extremities, paresthesias, numbness in the hands and feet, fatigue, "Sparks in her head", weakness and other neurologic deficits.  EMG nerve conduction study was performed on the bilateral upper extremities and one lower extremity.  MRI of the brain and cervical spine were also completed which were both unremarkable, extensive lab work has been unrevealing. Discussed this today with patient and mother, no neurologic etiologies found for patient's complaints will refer back to PCP.  Summary: All nerves and muscles (as detailed in the following tables) were within normal limits    Conclusion: This is a normal EMG nerve conduction study of the bilateral upper extremities and right lower extremity.  No evidence for mononeuropathy, polyneuropathy, radiculopathy, myopathy or myositis, neuromuscular junction disorder.   Naomie Dean M.D.  [facility] [address] [place], [place] [address] Tel: [phone] Fax: [phone]   A total of 15 additional minutes was spent in with this patient (not including time for emg/ncs) and over half this time was spent on counseling patient on the results of MRI brain an cervical spine.      MNC    Nerve / Sites Muscle Latency Ref. Amplitude Ref. Rel Amp Segments Distance Velocity Ref. Area    ms ms mV mV %  cm m/s m/s mVms  R Median - APB     Wrist APB 2.7 ?4.4 11.9 ?4.0 100 Wrist - APB 7   30.3     Upper arm APB 6.1  11.5  96.9 Upper arm - Wrist 21 61 ?49  29.6  L Median - APB     Wrist APB 2.8 ?4.4 11.5 ?4.0 100 Wrist - APB 7   35.1     Upper arm APB 6.3  11.3  98.3 Upper arm - Wrist 21 60 ?49 34.7  R Ulnar - ADM     Wrist ADM 2.0 ?3.3 6.9 ?6.0 100 Wrist - ADM 7   24.9     B.Elbow ADM 5.0  6.8  98.6 B.Elbow - Wrist 17 57 ?49 24.8     A.Elbow ADM 6.8  6.9  102 A.Elbow - B.Elbow 10 56 ?49 25.0         A.Elbow - Wrist      L Ulnar - ADM     Wrist ADM 2.0 ?3.3 7.3 ?6.0 100 Wrist - ADM 7   23.8     B.Elbow ADM 4.9  7.1  96.4 B.Elbow - Wrist 17 58 ?49 24.5     A.Elbow ADM 6.7  6.9  98.1 A.Elbow - B.Elbow 10 58 ?49 23.4         A.Elbow - Wrist      R Peroneal - EDB     Ankle EDB 3.9 ?6.5 8.8 ?2.0 100 Ankle - EDB 9   25.8     Fib head EDB 9.4  8.1  91.6 Fib head - Ankle 29 53 ?44 25.5     Pop fossa EDB 11.2  8.6  106 Pop fossa - Fib head 10 56 ?44 29.6  Pop fossa - Ankle      R Tibial - AH     Ankle AH 2.9 ?5.8 14.6 ?4.0 100 Ankle - AH 9   41.9     Pop fossa AH 9.9  11.9  81.2 Pop fossa - Ankle 35 50 ?41 38.8                 SNC    Nerve / Sites Rec. Site Peak Lat Ref.  Amp Ref. Segments Distance Peak Diff Ref.    ms ms V V  cm ms ms  R Sural - Ankle (Calf)     Calf Ankle 3.1 ?4.4 33 ?6 Calf - Ankle 14    R Superficial peroneal - Ankle     Lat leg Ankle 3.0 ?4.4 20 ?6 Lat leg - Ankle 14    R Median, Ulnar - Transcarpal comparison     Median Palm Wrist 1.9 ?2.2 74 ?35 Median Palm - Wrist 8       Ulnar Palm Wrist 1.9 ?2.2 17 ?12 Ulnar Palm - Wrist 8          Median Palm - Ulnar Palm  0.0 ?0.4  L Median, Ulnar - Transcarpal comparison     Median Palm Wrist 1.8 ?2.2 77 ?35 Median Palm - Wrist 8       Ulnar Palm Wrist 1.9 ?2.2 25 ?12 Ulnar Palm - Wrist 8          Median Palm - Ulnar Palm  -0.1 ?0.4  R Median - Orthodromic (Dig II, Mid palm)     Dig II Wrist 2.6 ?3.4 38 ?10 Dig II - Wrist 13    L Median - Orthodromic (Dig II, Mid palm)     Dig II Wrist 2.6 ?3.4 26 ?10 Dig II - Wrist 13    R Ulnar - Orthodromic, (Dig V, Mid palm)      Dig V Wrist 2.3 ?3.1 14 ?5 Dig V - Wrist 11    L Ulnar - Orthodromic, (Dig V, Mid palm)     Dig V Wrist 2.7 ?3.1 22 ?5 Dig V - Wrist 60                       F  Wave    Nerve F Lat Ref.   ms ms  R Tibial - AH 40.0 ?56.0  R Ulnar - ADM 22.9 ?32.0  L Ulnar - ADM 23.8 ?32.0           EMG full       EMG       EMG Summary Table    Spontaneous MUAP Recruitment  Muscle IA Fib PSW Fasc Other Amp Dur. Poly Pattern  R. Deltoid Normal None None None _______ Normal Normal Normal Normal  R. Triceps brachii Normal None None None _______ Normal Normal Normal Normal  R. Pronator teres Normal None None None _______ Normal Normal Normal Normal  R. Biceps brachii Normal None None None _______ Normal Normal Normal Normal  R. Opponens pollicis Normal None None None _______ Normal Normal Normal Normal  R. Vastus medialis Normal None None None _______ Normal Normal Normal Normal  R. Biceps femoris (long head) Normal None None None _______ Normal Normal Normal Normal  R. Tibialis anterior Normal None None None _______ Normal Normal Normal Normal  R. Gluteus medius Normal None None None _______ Normal Normal Normal Normal  R Abductor Hallucis Normal None None None _______ Normal Normal Normal Normal  R lower cervical and lower lumbar paraspinals Normal None None None ------------ Normal Normal Normal Normal

## 2017-02-08 NOTE — Progress Notes (Signed)
Full Name: Torria Fromer Gender: Female MRN #: [id number] Date of Birth: 1980/04/01    Visit Date: 02/08/2017 09:38 Age: 36 Years 8 Months Old Examining Physician: Naomie Dean, MD  Referring Physician: Sunnie Nielsen, DO, Roxanne Mins   History: Jomayra Novitsky is a 36 y.o. female with a PMHx Fibromyalgia, migraine, DM2 controlled, hypothyroidism.  Patient presented to neurology clinic with multiple neurologic complaints including chronic pain in the back, neck and all extremities, paresthesias, numbness in the hands and feet, fatigue, "Sparks in her head", weakness and other neurologic deficits.  EMG nerve conduction study was performed on the bilateral upper extremities and one lower extremity.  MRI of the brain and cervical spine were also completed which were both unremarkable, extensive lab work has been unrevealing. Discussed this today with patient and mother, no neurologic etiologies found for patient's complaints will refer back to PCP.  Summary: All nerves and muscles (as detailed in the following tables) were within normal limits    Conclusion: This is a normal EMG nerve conduction study of the bilateral upper extremities and right lower extremity.  No evidence for mononeuropathy, polyneuropathy, radiculopathy, myopathy or myositis, neuromuscular junction disorder. No neurologic etiologies found for patient's complaints will refer back to PCP.  Naomie Dean M.D.  [facility] [address] [place], [place] [address] Tel: [phone] Fax: [phone]   A total of 15 additional minutes was spent in with this patient (not including time for emg/ncs) and over half this time was spent on counseling patient on the results of MRI brain an cervical spine.      MNC    Nerve / Sites Muscle Latency Ref. Amplitude Ref. Rel Amp Segments Distance Velocity Ref. Area    ms ms mV mV %  cm m/s m/s mVms  R Median - APB     Wrist APB 2.7 ?4.4 11.9 ?4.0 100 Wrist -  APB 7   30.3     Upper arm APB 6.1  11.5  96.9 Upper arm - Wrist 21 61 ?49 29.6  L Median - APB     Wrist APB 2.8 ?4.4 11.5 ?4.0 100 Wrist - APB 7   35.1     Upper arm APB 6.3  11.3  98.3 Upper arm - Wrist 21 60 ?49 34.7  R Ulnar - ADM     Wrist ADM 2.0 ?3.3 6.9 ?6.0 100 Wrist - ADM 7   24.9     B.Elbow ADM 5.0  6.8  98.6 B.Elbow - Wrist 17 57 ?49 24.8     A.Elbow ADM 6.8  6.9  102 A.Elbow - B.Elbow 10 56 ?49 25.0         A.Elbow - Wrist      L Ulnar - ADM     Wrist ADM 2.0 ?3.3 7.3 ?6.0 100 Wrist - ADM 7   23.8     B.Elbow ADM 4.9  7.1  96.4 B.Elbow - Wrist 17 58 ?49 24.5     A.Elbow ADM 6.7  6.9  98.1 A.Elbow - B.Elbow 10 58 ?49 23.4         A.Elbow - Wrist      R Peroneal - EDB     Ankle EDB 3.9 ?6.5 8.8 ?2.0 100 Ankle - EDB 9   25.8     Fib head EDB 9.4  8.1  91.6 Fib head - Ankle 29 53 ?44 25.5     Pop fossa EDB 11.2  8.6  106 Pop fossa - Fib head 10 56 ?44 29.6         Pop fossa - Ankle      R Tibial - AH     Ankle AH 2.9 ?5.8 14.6 ?4.0 100 Ankle - AH 9   41.9     Pop fossa AH 9.9  11.9  81.2 Pop fossa - Ankle 35 50 ?41 38.8                 SNC    Nerve / Sites Rec. Site Peak Lat Ref.  Amp Ref. Segments Distance Peak Diff Ref.    ms ms V V  cm ms ms  R Sural - Ankle (Calf)     Calf Ankle 3.1 ?4.4 33 ?6 Calf - Ankle 14    R Superficial peroneal - Ankle     Lat leg Ankle 3.0 ?4.4 20 ?6 Lat leg - Ankle 14    R Median, Ulnar - Transcarpal comparison     Median Palm Wrist 1.9 ?2.2 74 ?35 Median Palm - Wrist 8       Ulnar Palm Wrist 1.9 ?2.2 17 ?12 Ulnar Palm - Wrist 8          Median Palm - Ulnar Palm  0.0 ?0.4  L Median, Ulnar - Transcarpal comparison     Median Palm Wrist 1.8 ?2.2 77 ?35 Median Palm - Wrist 8       Ulnar Palm Wrist 1.9 ?2.2 25 ?12 Ulnar Palm - Wrist 8          Median Palm - Ulnar Palm  -0.1 ?0.4  R Median - Orthodromic (Dig II, Mid palm)     Dig II Wrist 2.6 ?3.4 38 ?10 Dig II - Wrist 13    L Median - Orthodromic (Dig II, Mid palm)     Dig II Wrist 2.6  ?3.4 26 ?10 Dig II - Wrist 13    R Ulnar - Orthodromic, (Dig V, Mid palm)     Dig V Wrist 2.3 ?3.1 14 ?5 Dig V - Wrist 11    L Ulnar - Orthodromic, (Dig V, Mid palm)     Dig V Wrist 2.7 ?3.1 22 ?5 Dig V - Wrist 64                       F  Wave    Nerve F Lat Ref.   ms ms  R Tibial - AH 40.0 ?56.0  R Ulnar - ADM 22.9 ?32.0  L Ulnar - ADM 23.8 ?32.0           EMG full       EMG       EMG Summary Table    Spontaneous MUAP Recruitment  Muscle IA Fib PSW Fasc Other Amp Dur. Poly Pattern  R. Deltoid Normal None None None _______ Normal Normal Normal Normal  R. Triceps brachii Normal None None None _______ Normal Normal Normal Normal  R. Pronator teres Normal None None None _______ Normal Normal Normal Normal  R. Biceps brachii Normal None None None _______ Normal Normal Normal Normal  R. Opponens pollicis Normal None None None _______ Normal Normal Normal Normal  R. Vastus medialis Normal None None None _______ Normal Normal Normal Normal  R. Biceps femoris (long head) Normal None None None _______ Normal Normal Normal Normal  R. Tibialis anterior Normal None None None _______ Normal Normal Normal Normal  R. Gluteus medius Normal None None None  _______ Normal Normal Normal Normal  R Abductor Hallucis Normal None None None _______ Normal Normal Normal Normal  R lower cervical and lower lumbar paraspinals Normal None None None ------------ Normal Normal Normal Normal

## 2017-06-13 DIAGNOSIS — R51 Headache: Secondary | ICD-10-CM | POA: Diagnosis not present

## 2017-06-30 DIAGNOSIS — J3089 Other allergic rhinitis: Secondary | ICD-10-CM | POA: Diagnosis not present

## 2017-06-30 DIAGNOSIS — H1045 Other chronic allergic conjunctivitis: Secondary | ICD-10-CM | POA: Diagnosis not present

## 2017-06-30 DIAGNOSIS — J301 Allergic rhinitis due to pollen: Secondary | ICD-10-CM | POA: Diagnosis not present

## 2017-06-30 DIAGNOSIS — J3081 Allergic rhinitis due to animal (cat) (dog) hair and dander: Secondary | ICD-10-CM | POA: Diagnosis not present

## 2017-07-04 DIAGNOSIS — G43909 Migraine, unspecified, not intractable, without status migrainosus: Secondary | ICD-10-CM | POA: Diagnosis not present

## 2017-07-11 DIAGNOSIS — L739 Follicular disorder, unspecified: Secondary | ICD-10-CM | POA: Diagnosis not present

## 2017-07-20 DIAGNOSIS — G43009 Migraine without aura, not intractable, without status migrainosus: Secondary | ICD-10-CM | POA: Diagnosis not present

## 2017-07-20 DIAGNOSIS — G894 Chronic pain syndrome: Secondary | ICD-10-CM | POA: Diagnosis not present

## 2017-07-20 DIAGNOSIS — G43709 Chronic migraine without aura, not intractable, without status migrainosus: Secondary | ICD-10-CM | POA: Diagnosis not present

## 2017-08-09 DIAGNOSIS — J3081 Allergic rhinitis due to animal (cat) (dog) hair and dander: Secondary | ICD-10-CM | POA: Diagnosis not present

## 2017-08-09 DIAGNOSIS — J301 Allergic rhinitis due to pollen: Secondary | ICD-10-CM | POA: Diagnosis not present

## 2017-08-09 DIAGNOSIS — J3089 Other allergic rhinitis: Secondary | ICD-10-CM | POA: Diagnosis not present

## 2017-08-14 ENCOUNTER — Other Ambulatory Visit: Payer: Self-pay | Admitting: Osteopathic Medicine

## 2017-08-14 DIAGNOSIS — I10 Essential (primary) hypertension: Secondary | ICD-10-CM

## 2017-08-15 DIAGNOSIS — J069 Acute upper respiratory infection, unspecified: Secondary | ICD-10-CM | POA: Diagnosis not present

## 2017-08-15 DIAGNOSIS — M797 Fibromyalgia: Secondary | ICD-10-CM | POA: Diagnosis not present

## 2017-08-20 DIAGNOSIS — Z0000 Encounter for general adult medical examination without abnormal findings: Secondary | ICD-10-CM | POA: Diagnosis not present

## 2017-08-20 DIAGNOSIS — I10 Essential (primary) hypertension: Secondary | ICD-10-CM | POA: Diagnosis not present

## 2017-08-20 DIAGNOSIS — E039 Hypothyroidism, unspecified: Secondary | ICD-10-CM | POA: Diagnosis not present

## 2017-08-20 DIAGNOSIS — E1140 Type 2 diabetes mellitus with diabetic neuropathy, unspecified: Secondary | ICD-10-CM | POA: Diagnosis not present

## 2017-08-22 DIAGNOSIS — E1140 Type 2 diabetes mellitus with diabetic neuropathy, unspecified: Secondary | ICD-10-CM | POA: Diagnosis not present

## 2017-08-22 DIAGNOSIS — R110 Nausea: Secondary | ICD-10-CM | POA: Diagnosis not present

## 2017-08-22 DIAGNOSIS — R42 Dizziness and giddiness: Secondary | ICD-10-CM | POA: Diagnosis not present

## 2017-09-04 ENCOUNTER — Other Ambulatory Visit: Payer: Self-pay | Admitting: Osteopathic Medicine

## 2017-09-04 DIAGNOSIS — I10 Essential (primary) hypertension: Secondary | ICD-10-CM

## 2017-10-24 DIAGNOSIS — G43009 Migraine without aura, not intractable, without status migrainosus: Secondary | ICD-10-CM | POA: Diagnosis not present

## 2017-10-24 DIAGNOSIS — G894 Chronic pain syndrome: Secondary | ICD-10-CM | POA: Diagnosis not present

## 2017-10-24 DIAGNOSIS — G43709 Chronic migraine without aura, not intractable, without status migrainosus: Secondary | ICD-10-CM | POA: Diagnosis not present

## 2017-11-05 DIAGNOSIS — R079 Chest pain, unspecified: Secondary | ICD-10-CM | POA: Diagnosis not present

## 2017-11-05 DIAGNOSIS — E119 Type 2 diabetes mellitus without complications: Secondary | ICD-10-CM | POA: Diagnosis not present

## 2017-11-05 DIAGNOSIS — M797 Fibromyalgia: Secondary | ICD-10-CM | POA: Diagnosis not present

## 2017-11-05 DIAGNOSIS — I10 Essential (primary) hypertension: Secondary | ICD-10-CM | POA: Diagnosis not present

## 2017-11-05 DIAGNOSIS — R0789 Other chest pain: Secondary | ICD-10-CM | POA: Diagnosis not present

## 2017-11-13 DIAGNOSIS — Z719 Counseling, unspecified: Secondary | ICD-10-CM | POA: Diagnosis not present

## 2017-11-15 DIAGNOSIS — J018 Other acute sinusitis: Secondary | ICD-10-CM | POA: Diagnosis not present

## 2017-11-15 DIAGNOSIS — H109 Unspecified conjunctivitis: Secondary | ICD-10-CM | POA: Diagnosis not present

## 2017-11-23 DIAGNOSIS — E782 Mixed hyperlipidemia: Secondary | ICD-10-CM | POA: Diagnosis not present

## 2017-11-23 DIAGNOSIS — E1140 Type 2 diabetes mellitus with diabetic neuropathy, unspecified: Secondary | ICD-10-CM | POA: Diagnosis not present

## 2017-11-23 DIAGNOSIS — M797 Fibromyalgia: Secondary | ICD-10-CM | POA: Diagnosis not present

## 2017-11-23 DIAGNOSIS — J3089 Other allergic rhinitis: Secondary | ICD-10-CM | POA: Diagnosis not present

## 2017-11-23 DIAGNOSIS — E038 Other specified hypothyroidism: Secondary | ICD-10-CM | POA: Diagnosis not present

## 2017-11-23 DIAGNOSIS — I10 Essential (primary) hypertension: Secondary | ICD-10-CM | POA: Diagnosis not present

## 2017-12-14 DIAGNOSIS — M2550 Pain in unspecified joint: Secondary | ICD-10-CM | POA: Diagnosis not present

## 2017-12-14 DIAGNOSIS — M791 Myalgia, unspecified site: Secondary | ICD-10-CM | POA: Diagnosis not present

## 2017-12-14 DIAGNOSIS — H04129 Dry eye syndrome of unspecified lacrimal gland: Secondary | ICD-10-CM | POA: Diagnosis not present

## 2017-12-14 DIAGNOSIS — D8989 Other specified disorders involving the immune mechanism, not elsewhere classified: Secondary | ICD-10-CM | POA: Diagnosis not present

## 2018-01-01 DIAGNOSIS — J3081 Allergic rhinitis due to animal (cat) (dog) hair and dander: Secondary | ICD-10-CM | POA: Diagnosis not present

## 2018-01-01 DIAGNOSIS — J3089 Other allergic rhinitis: Secondary | ICD-10-CM | POA: Diagnosis not present

## 2018-01-01 DIAGNOSIS — J301 Allergic rhinitis due to pollen: Secondary | ICD-10-CM | POA: Diagnosis not present

## 2018-08-14 ENCOUNTER — Emergency Department (HOSPITAL_BASED_OUTPATIENT_CLINIC_OR_DEPARTMENT_OTHER)
Admission: EM | Admit: 2018-08-14 | Discharge: 2018-08-14 | Disposition: A | Discharge status: Home / Self Care | Payer: 59 | Attending: Emergency Medicine | Admitting: Emergency Medicine

## 2018-08-14 ENCOUNTER — Encounter (HOSPITAL_BASED_OUTPATIENT_CLINIC_OR_DEPARTMENT_OTHER): Payer: Self-pay

## 2018-08-14 ENCOUNTER — Other Ambulatory Visit: Payer: Self-pay

## 2018-08-14 ENCOUNTER — Emergency Department (HOSPITAL_BASED_OUTPATIENT_CLINIC_OR_DEPARTMENT_OTHER): Payer: 59

## 2018-08-14 DIAGNOSIS — Z79899 Other long term (current) drug therapy: Secondary | ICD-10-CM | POA: Insufficient documentation

## 2018-08-14 DIAGNOSIS — R079 Chest pain, unspecified: Secondary | ICD-10-CM | POA: Diagnosis not present

## 2018-08-14 DIAGNOSIS — Z7984 Long term (current) use of oral hypoglycemic drugs: Secondary | ICD-10-CM | POA: Diagnosis not present

## 2018-08-14 DIAGNOSIS — R51 Headache: Secondary | ICD-10-CM | POA: Diagnosis not present

## 2018-08-14 DIAGNOSIS — E119 Type 2 diabetes mellitus without complications: Secondary | ICD-10-CM | POA: Insufficient documentation

## 2018-08-14 DIAGNOSIS — R519 Headache, unspecified: Secondary | ICD-10-CM

## 2018-08-14 DIAGNOSIS — I10 Essential (primary) hypertension: Secondary | ICD-10-CM

## 2018-08-14 HISTORY — DX: Fibromyalgia: M79.7

## 2018-08-14 HISTORY — DX: Anemia, unspecified: D64.9

## 2018-08-14 HISTORY — DX: Disorder of thyroid, unspecified: E07.9

## 2018-08-14 HISTORY — DX: Migraine, unspecified, not intractable, without status migrainosus: G43.909

## 2018-08-14 HISTORY — DX: Essential (primary) hypertension: I10

## 2018-08-14 HISTORY — DX: Pure hypercholesterolemia, unspecified: E78.00

## 2018-08-14 LAB — COMPREHENSIVE METABOLIC PANEL
ALT: 11 U/L (ref 0–44)
AST: 13 U/L — ABNORMAL LOW (ref 15–41)
Albumin: 4.3 g/dL (ref 3.5–5.0)
Alkaline Phosphatase: 61 U/L (ref 38–126)
Anion gap: 9 (ref 5–15)
BUN: 11 mg/dL (ref 6–20)
CO2: 23 mmol/L (ref 22–32)
Calcium: 9.3 mg/dL (ref 8.9–10.3)
Chloride: 105 mmol/L (ref 98–111)
Creatinine, Ser: 0.79 mg/dL (ref 0.44–1.00)
GFR calc Af Amer: >60 mL/min (ref >60)
GFR calc non Af Amer: >60 mL/min (ref >60)
Glucose, Bld: 106 mg/dL — ABNORMAL HIGH (ref 70–99)
Potassium: 3.4 mmol/L — ABNORMAL LOW (ref 3.5–5.1)
Sodium: 137 mmol/L (ref 135–145)
Total Bilirubin: 0.4 mg/dL (ref 0.3–1.2)
Total Protein: 7.9 g/dL (ref 6.5–8.1)

## 2018-08-14 LAB — URINALYSIS, ROUTINE W REFLEX MICROSCOPIC
Bilirubin Urine: NEGATIVE
Glucose, UA: NEGATIVE mg/dL
Hgb urine dipstick: NEGATIVE
Ketones, ur: NEGATIVE mg/dL
Leukocytes,Ua: NEGATIVE
Nitrite: NEGATIVE
Protein, ur: NEGATIVE mg/dL
Specific Gravity, Urine: 1.025 (ref 1.005–1.030)
pH: 6 (ref 5.0–8.0)

## 2018-08-14 LAB — CBC WITH DIFFERENTIAL/PLATELET
Abs Immature Granulocytes: 0.01 10*3/uL (ref 0.00–0.07)
Basophils Absolute: 0 10*3/uL (ref 0.0–0.1)
Basophils Relative: 1 %
Eosinophils Absolute: 0.3 10*3/uL (ref 0.0–0.5)
Eosinophils Relative: 4 %
HCT: 36.5 % (ref 36.0–46.0)
Hemoglobin: 11.6 g/dL — ABNORMAL LOW (ref 12.0–15.0)
Immature Granulocytes: 0 %
Lymphocytes Relative: 29 %
Lymphs Abs: 1.9 10*3/uL (ref 0.7–4.0)
MCH: 29.5 pg (ref 26.0–34.0)
MCHC: 31.8 g/dL (ref 30.0–36.0)
MCV: 92.9 fL (ref 80.0–100.0)
Monocytes Absolute: 0.4 10*3/uL (ref 0.1–1.0)
Monocytes Relative: 6 %
Neutro Abs: 4.2 10*3/uL (ref 1.7–7.7)
Neutrophils Relative %: 60 %
Platelets: 311 10*3/uL (ref 150–400)
RBC: 3.93 MIL/uL (ref 3.87–5.11)
RDW: 11.9 % (ref 11.5–15.5)
WBC: 6.8 10*3/uL (ref 4.0–10.5)
nRBC: 0 % (ref 0.0–0.2)

## 2018-08-14 LAB — LIPASE, BLOOD: Lipase: 43 U/L (ref 11–51)

## 2018-08-14 LAB — TROPONIN I (HIGH SENSITIVITY)
Troponin I (High Sensitivity): <2 ng/L (ref <18)
Troponin I (High Sensitivity): <2 ng/L (ref <18)

## 2018-08-14 LAB — PREGNANCY, URINE: Preg Test, Ur: NEGATIVE

## 2018-08-14 MED ORDER — KETOROLAC TROMETHAMINE 30 MG/ML IJ SOLN
30.0000 mg | Freq: Once | INTRAMUSCULAR | Status: AC
  Administered 2018-08-14: 30 mg via INTRAVENOUS
  Filled 2018-08-14: qty 1

## 2018-08-14 MED ORDER — KETOROLAC TROMETHAMINE 30 MG/ML IJ SOLN: 30.0000 mg | Freq: Once | INTRAMUSCULAR | Status: DC

## 2018-08-14 MED ORDER — PROCHLORPERAZINE EDISYLATE 10 MG/2ML IJ SOLN
10.0000 mg | Freq: Once | INTRAMUSCULAR | Status: AC
  Administered 2018-08-14: 20:00:00 10 mg via INTRAVENOUS
  Filled 2018-08-14: qty 2

## 2018-08-14 MED ORDER — SODIUM CHLORIDE 0.9 % IV BOLUS
1000.0000 mL | Freq: Once | INTRAVENOUS | Status: AC
  Administered 2018-08-14: 1000 mL via INTRAVENOUS

## 2018-08-14 MED ORDER — DIPHENHYDRAMINE HCL 50 MG/ML IJ SOLN
25.0000 mg | Freq: Once | INTRAMUSCULAR | Status: AC
  Administered 2018-08-14: 25 mg via INTRAVENOUS
  Filled 2018-08-14: qty 1

## 2018-08-14 NOTE — ED Notes (Signed)
Patient transported to X-ray 

## 2018-08-14 NOTE — ED Notes (Signed)
Pt reports CP earlier in the day described as sharp that was brief (seconds) and then went away. Pt denies radiation of pain.

## 2018-08-14 NOTE — ED Triage Notes (Signed)
Pt states she has not been feeling well since last Tuesday. Pt report low grade fever, N/V/D, ShOB. Yesterday pt started having CP and fluttering. Pt was seen by UC today and sent here.

## 2018-08-14 NOTE — Discharge Instructions (Addendum)
You were seen in the ED today for chest pain, headache, and high blood pressure. Your labwork was reassuring today; your chest xray and CAT scan of your head were also negative. Please follow up with your PCP regarding your ED visit today; you may need a holter monitor to assess this chest pain and palpitations that you have been having; you may also need to have your blood pressure medications altered; this is likely what is causing your headache. Return to the ED for any worsening symptoms.

## 2018-08-14 NOTE — ED Provider Notes (Signed)
[facility] EMERGENCY DEPARTMENT Provider Note   CSN: [id number] Arrival date & time: 08/14/18  1818    History   Chief Complaint Chief Complaint  Patient presents with  . Chest Pain    HPI Hannah Taylor is a 38 y.o. female with PMHx HTN, DM, GERD, and fibromyalgia who presents to the ED with multiple complaints. She reports that for the last week she has had a gradual onset, constant, achy, diffuse headache as well as nausea, 2 episodes of vomiting that resolved 1 week ago, and epigastric abdominal pain. She also endorses intermittent episodes of shortness of breath as well as chest pain that began today. Pt states sudden onset substernal chest pain that lasted a few seconds then went away; she experienced these symptoms twice today. She also reports her blood pressure has been high for the past week. She called EMS last week and reports her blood pressure was 185 systolic; she was advised to come to the ED at that point but declined because she had her daughter with her. Pt then went to see her PCP Friday 06/19 and had metoprolol added to her blood pressure regimen. Pt was already taking Lisinopril and HCTZ. She reports it slightly helped her blood pressure but it has still been running high. Pt was tested for covid 19 at that point which came back negative today. She was seen again today by her PCP due to chest pain and sent here for further evaluation. Pt is currently chest pain free. She has never had chest pain like this in the past. No FHx CAD. Pt is a never smoker. No recent prolonged travel or immobilization. No estrogen therapy. No hx DVT/PE. No hemoptysis.      Past Medical History:  Diagnosis Date  . Anemia   . Diabetes mellitus without complication (HCC)   . Fibromyalgia   . GERD (gastroesophageal reflux disease)   . High cholesterol   . Hypertension   . Migraine   . Thyroid disease     There are no active problems to display for this patient.   Past Surgical  History:  Procedure Laterality Date  . ABDOMINAL SURGERY    . BREAST SURGERY    . THYROIDECTOMY    . UTERINE FIBROID EMBOLIZATION       OB History   No obstetric history on file.      Home Medications    Prior to Admission medications   Medication Sig Start Date End Date Taking? Authorizing Provider  chlorzoxazone (PARAFON) 500 MG tablet Take by mouth 4 (four) times daily as needed for muscle spasms.   Yes [provider]  dexlansoprazole (DEXILANT) 60 MG capsule Take 60 mg by mouth daily.   Yes [provider]  diclofenac (FLECTOR) 1.3 % PTCH Place 1 patch onto the skin 2 (two) times daily.   Yes [provider]  fluticasone (FLONASE) 50 MCG/ACT nasal spray Place into both nostrils daily.   Yes [provider]  Galcanezumab-gnlm (EMGALITY) 120 MG/ML SOAJ Inject into the skin.   Yes [provider]  hydrochlorothiazide (HYDRODIURIL) 25 MG tablet Take 25 mg by mouth daily.   Yes [provider]  levocetirizine (XYZAL) 5 MG tablet Take 5 mg by mouth every evening.   Yes [provider]  levothyroxine (SYNTHROID) 137 MCG tablet Take 137 mcg by mouth daily before breakfast.   Yes [provider]  lisinopril (ZESTRIL) 10 MG tablet Take 10 mg by mouth daily.   Yes [provider]  medroxyPROGESTERone (DEPO-PROVERA) 150 MG/ML injection Inject 150 mg into the muscle every 3 (three) months.   Yes [provider]  metFORMIN (GLUCOPHAGE-XR) 500 MG 24 hr tablet Take 500 mg by mouth daily with breakfast.   Yes [provider]  methylphenidate 27 MG PO CR tablet Take 27 mg by mouth every morning.   Yes [provider]  metoprolol succinate (TOPROL-XL) 50 MG 24 hr tablet Take 50 mg by mouth daily. Take with or immediately following a meal.   Yes [provider]  Plecanatide (TRULANCE) 3 MG TABS Take by mouth.   Yes [provider]  pregabalin (LYRICA) 225 MG capsule Take 225  mg by mouth 2 (two) times daily.   Yes [provider]  promethazine (PHENERGAN) 25 MG tablet Take 25 mg by mouth every 6 (six) hours as needed for nausea or vomiting.   Yes [provider]  sertraline (ZOLOFT) 50 MG tablet Take 50 mg by mouth daily.   Yes [provider]  traMADol (ULTRAM) 50 MG tablet Take by mouth every 6 (six) hours as needed.   Yes [provider]  zolpidem (AMBIEN) 10 MG tablet Take 10 mg by mouth at bedtime as needed for sleep.   Yes [provider]    Family History No family history on file.  Social History Social History   Tobacco Use  . Smoking status: Never Smoker  . Smokeless tobacco: Never Used  Substance Use Topics  . Alcohol use: Yes  . Drug use: Never     Allergies   Hydrocodone, Iodine, and Oxycodone   Review of Systems Review of Systems  Constitutional: Negative for chills and fever.  HENT: Negative for congestion.   Eyes: Negative for visual disturbance.  Respiratory: Positive for shortness of breath.   Cardiovascular: Positive for chest pain. Negative for leg swelling.  Gastrointestinal: Positive for abdominal pain, nausea and vomiting. Negative for blood in stool, constipation and diarrhea.  Genitourinary: Negative for difficulty urinating.  Musculoskeletal: Positive for back pain. Negative for neck pain and neck stiffness.  Skin: Negative for rash.  Neurological: Positive for light-headedness and headaches. Negative for syncope, weakness and numbness.     Physical Exam Updated Vital Signs BP (!) 158/105 (BP Location: Right Arm)   Pulse 92   Temp 98.8 F (37.1 C) (Oral)   Resp 20   Ht 5\' 3"  (1.6 m)   Wt 73 kg   SpO2 99%   BMI 28.52 kg/m   Physical Exam Vitals signs and nursing note reviewed.  Constitutional:      Appearance: She is not ill-appearing.  HENT:     Head: Normocephalic and atraumatic.  Eyes:     Extraocular Movements: Extraocular movements intact.      Conjunctiva/sclera: Conjunctivae normal.     Pupils: Pupils are equal, round, and reactive to light.  Neck:     Musculoskeletal: Normal range of motion and neck supple.     Meningeal: Brudzinski's sign and Kernig's sign absent.  Cardiovascular:     Rate and Rhythm: Normal rate and regular rhythm.     Pulses:          Radial pulses are 2+ on the right side and 2+ on the left side.       Dorsalis pedis pulses are 2+ on the right side and 2+ on the left side.     Heart sounds: Normal heart sounds.  Pulmonary:     Effort: Pulmonary effort is normal. No  respiratory distress.     Breath sounds: Normal breath sounds. No decreased breath sounds, wheezing, rhonchi or rales.  Abdominal:     Palpations: Abdomen is soft.     Tenderness: There is no abdominal tenderness. There is no guarding or rebound.  Musculoskeletal: Normal range of motion.     Right lower leg: No edema.     Left lower leg: No edema.  Skin:    General: Skin is warm and dry.     Findings: No rash.  Neurological:     General: No focal deficit present.     Mental Status: She is alert and oriented to person, place, and time.     Cranial Nerves: No cranial nerve deficit.     Motor: No weakness.      ED Treatments / Results  Labs (all labs ordered are listed, but only abnormal results are displayed) Labs Reviewed  COMPREHENSIVE METABOLIC PANEL - Abnormal; Notable for the following components:      Result Value   Potassium 3.4 (*)    Glucose, Bld 106 (*)    AST 13 (*)    All other components within normal limits  CBC WITH DIFFERENTIAL/PLATELET - Abnormal; Notable for the following components:   Hemoglobin 11.6 (*)    All other components within normal limits  LIPASE, BLOOD  TROPONIN I (HIGH SENSITIVITY)  TROPONIN I (HIGH SENSITIVITY)  URINALYSIS, ROUTINE W REFLEX MICROSCOPIC  PREGNANCY, URINE    EKG None  Radiology Dg Chest 2 View  Result Date: 08/14/2018 CLINICAL DATA:  Fever EXAM: CHEST - 2 VIEW  COMPARISON:  None. FINDINGS: The heart size and mediastinal contours are within normal limits. Both lungs are clear. The visualized skeletal structures are unremarkable. There are surgical clips in the patient's low neck which may be secondary to prior thyroidectomy. IMPRESSION: No active cardiopulmonary disease. Electronically Signed   By: Katherine Mantlehristopher  Green M.D.   On: 08/14/2018 19:52   Ct Head Wo Contrast  Result Date: 08/14/2018 CLINICAL DATA:  Headache. EXAM: CT HEAD WITHOUT CONTRAST TECHNIQUE: Contiguous axial images were obtained from the base of the skull through the vertex without intravenous contrast. COMPARISON:  None. FINDINGS: Brain: No evidence of acute infarction, hemorrhage, hydrocephalus, extra-axial collection or mass lesion/mass effect. Vascular: No hyperdense vessel or unexpected calcification. Skull: Normal. Negative for fracture or focal lesion. Sinuses/Orbits: There are small air-fluid levels in the maxillary sinuses bilaterally. The remaining paranasal sinuses and mastoid air cells are essentially clear. Other: None. IMPRESSION: 1. No acute intracranial abnormality detected. 2. Small air-fluid levels are noted in the partially visualized maxillary sinuses. Electronically Signed   By: Katherine Mantlehristopher  Green M.D.   On: 08/14/2018 19:55    Procedures Procedures (including critical care time)  Medications Ordered in ED Medications  prochlorperazine (COMPAZINE) injection 10 mg (10 mg Intravenous Given 08/14/18 1955)  diphenhydrAMINE (BENADRYL) injection 25 mg (25 mg Intravenous Given 08/14/18 1953)  sodium chloride 0.9 % bolus 1,000 mL ( Intravenous Stopped 08/14/18 2115)  ketorolac (TORADOL) 30 MG/ML injection 30 mg (30 mg Intravenous Given 08/14/18 2015)     Initial Impression / Assessment and Plan / ED Course  I have reviewed the triage vital signs and the nursing notes.  Pertinent labs & imaging results that were available during my care of the patient were reviewed by me and  considered in my medical decision making (see chart for details).    38 year old female presenting for multiple complaints including headache, high blood pressure, abdominal pain, nausea, vomiting  that has since resolved, chest pain, shortness of breath, and palpitations. Pt has been seen by PCP twice in the past week; initially had blood pressure medication changed last week to include metoprolol with lisinopril and HCTZ; pt also tested for covid 19 at that time which was negative. Seen again today after chest pain and sent here for further eval. Pt denies hx of headaches. Her blood pressure has been as high as 546 systolic per patient reports; today 158/105. No focal neuro deficits on exam although given complaint of HA and HTN will obtain CT Head to rule out any intracranial abnormalities. Pt given headache cocktail to include compazine and benadryl; NSAIDs held at this time prior to CT results. Will also obtain baseline bloodwork including CBC, CMP, lipase, urine preg, U/A, troponin, CXR, and EKG. EKG without ischemic changes. PERC negative today; no D-dimer ordered. Pt non tender to abdomen on exam; do not feel she needs CT A/P or other imaging of abdomen at this time. Will reevaluate once labs and imaging return.   CT Head negative; Toradol and IVF given for symptomatic relief of headache. Labs reassuring as well; high sensitivity troponin negative. Mild hypokalemia at 3.4; do not feel patient needs replacement in the ED today. Will discuss potassium rich foods for her to eat. No other electrolyte abnormalities. Kidney function and LFTs within normal limits. CBC without leukocytosis; pt does have anemia with hgb 11.6 for which she is on iron supplements. Urine preg neg. Lipase negative. U/A clear without infection.   Upon reeval; pt reports her headache has improved with the headache cocktail. She is still concerned about her other symptoms. Pt has been sleeping comfortably while being evaluated. No  episodes of emesis in the ED. Blood pressure is mildly elevated although do not feel patient needs any antihypertensives in the ED; will have her follow outpatient for same. Repeat delta trop to be drawn prior to discharge home to ensure to increase.   Delta trop negative. Will discharge patient home with PCP follow up; it appears PCP was considering holter monitoring for patient prior to ED visit; feel this is an appropriate plan as well as addressing blood pressure meds. Pt is in agreement with plan at this time and stable for discharge home. Strict return precautions discussed.   HEAR Score: 1    Final Clinical Impressions(s) / ED Diagnoses   Final diagnoses:  Essential hypertension  Nonspecific chest pain  Acute nonintractable headache, unspecified headache type    ED Discharge Orders    None       Eustaquio Maize, Hershal Coria 08/14/18 2221    Julianne Rice, MD 08/15/18 1520

## 2018-08-20 ENCOUNTER — Encounter: Payer: Self-pay | Admitting: Neurology

## 2019-01-29 ENCOUNTER — Other Ambulatory Visit: Payer: Self-pay | Admitting: Physical Medicine and Rehabilitation

## 2019-01-29 DIAGNOSIS — M47812 Spondylosis without myelopathy or radiculopathy, cervical region: Secondary | ICD-10-CM

## 2019-02-13 ENCOUNTER — Other Ambulatory Visit: Payer: Managed Care, Other (non HMO)

## 2019-02-27 ENCOUNTER — Other Ambulatory Visit: Payer: Managed Care, Other (non HMO)

## 2019-03-12 ENCOUNTER — Ambulatory Visit
Admission: RE | Admit: 2019-03-12 | Discharge: 2019-03-12 | Disposition: A | Discharge status: Home / Self Care | Payer: 59 | Source: Ambulatory Visit | Attending: Physical Medicine and Rehabilitation | Admitting: Physical Medicine and Rehabilitation

## 2019-03-12 ENCOUNTER — Other Ambulatory Visit: Payer: Self-pay

## 2019-03-12 DIAGNOSIS — M47812 Spondylosis without myelopathy or radiculopathy, cervical region: Secondary | ICD-10-CM

## 2021-06-25 IMAGING — MR MR CERVICAL SPINE W/O CM
4 of 5 series · 22 of 48 positions shown · non-contrast
Comparison: None.

CLINICAL DATA: Diffuse neck and mid back pain for the past 3-4
months. Bilateral arm pain.

EXAM:
MRI CERVICAL SPINE WITHOUT CONTRAST
TECHNIQUE: Multiplanar, multisequence MR imaging of the cervical spine was
performed. No intravenous contrast was administered.

[Series 2: T2 · sagittal · 3.0mm · 0.41mm/px · 8 of 16 slices shown (1 of 3)]
[im 1/16]
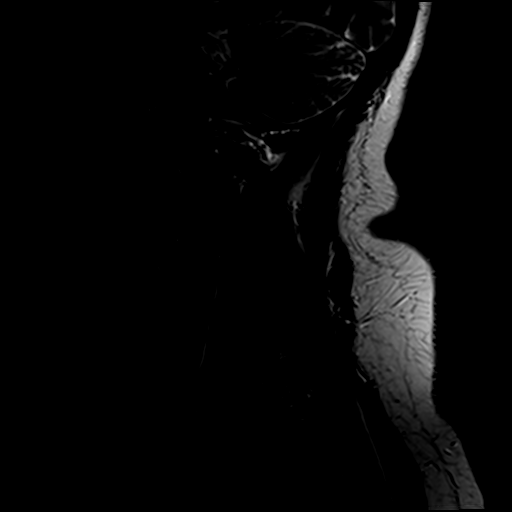
[im 3/16]
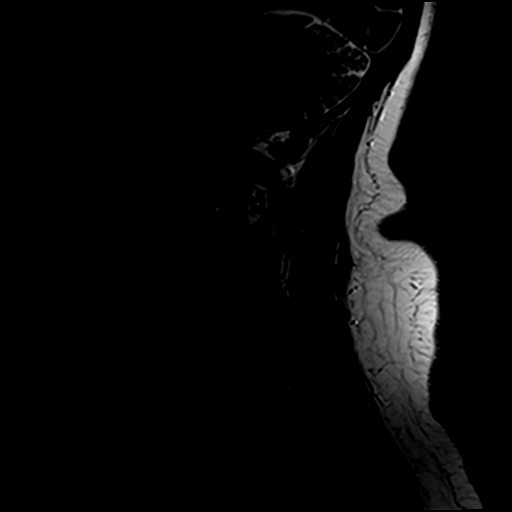
[im 5/16]
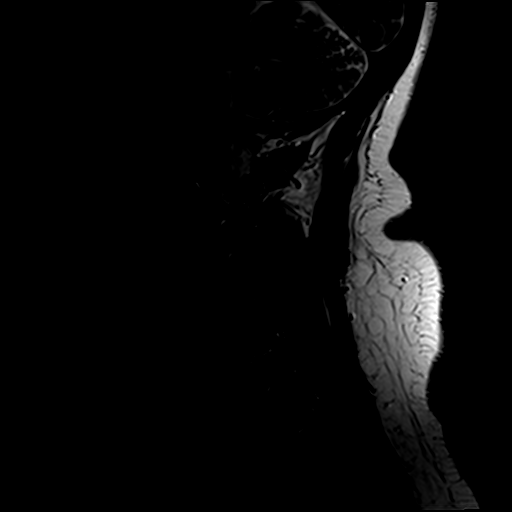
[im 7/16]
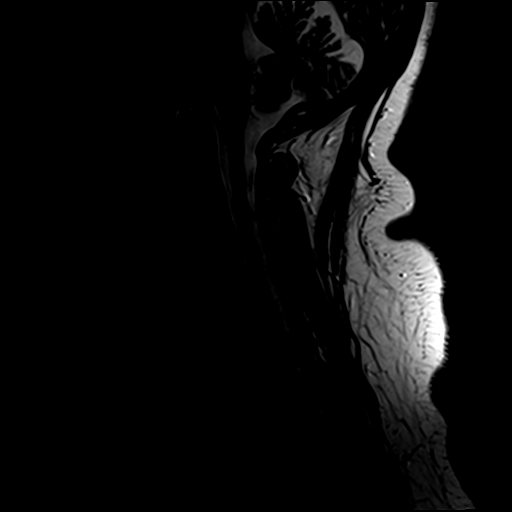
[im 9/16]
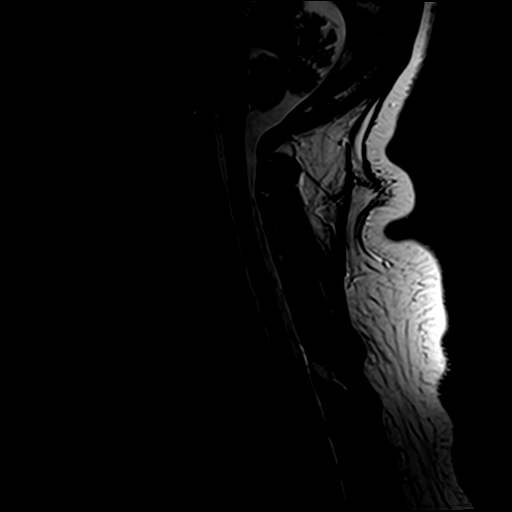
[im 11/16]
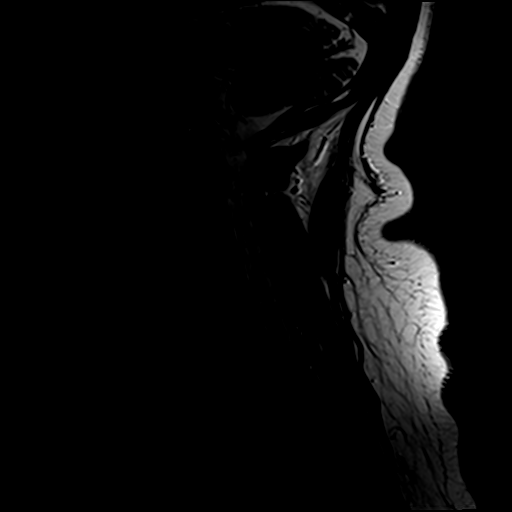
[im 13/16]
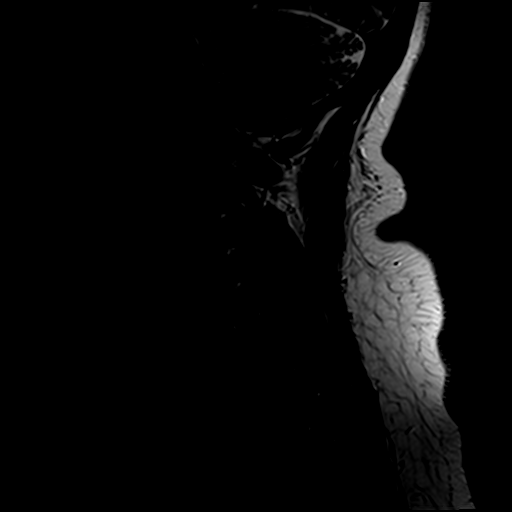
[im 16/16]
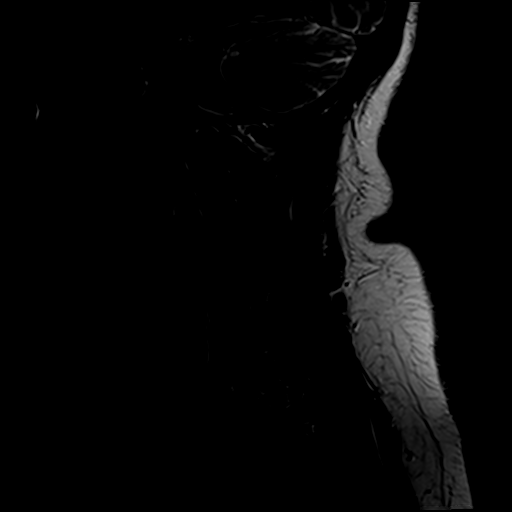

[Series 3: T1 · sagittal · 3.0mm · 0.41mm/px · 3 of 16 slices shown]
[im 3/16]
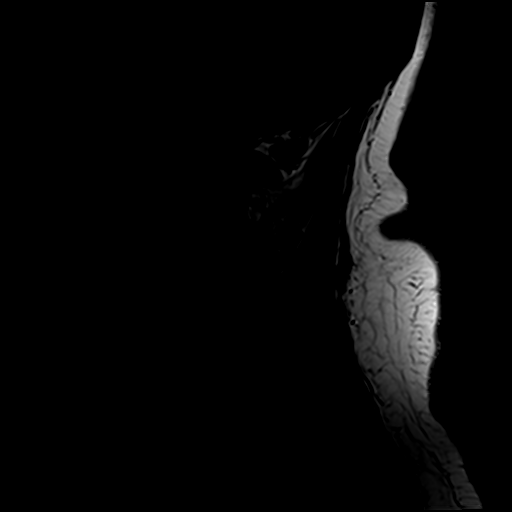
[im 9/16]
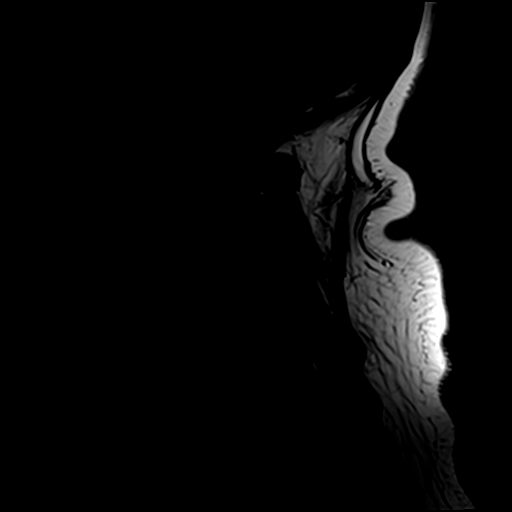
[im 13/16]
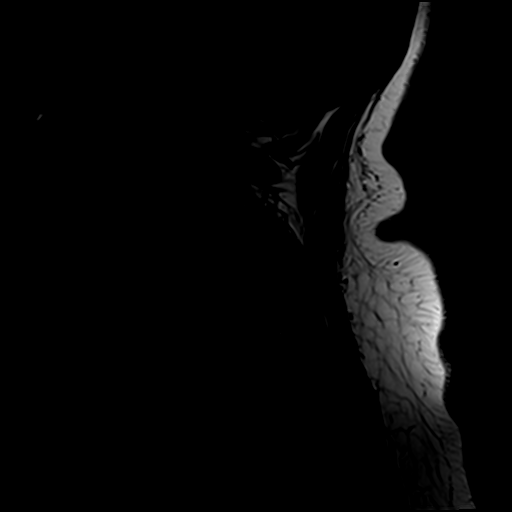

[Series 5: T2 · axial · 3.0mm · 0.39mm/px · z∈[-45,+28]mm · 8 of 24 slices shown (2 of 3)]
[im 1/24]
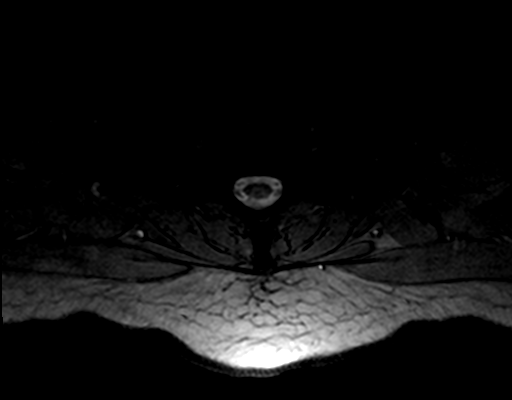
[im 5/24]
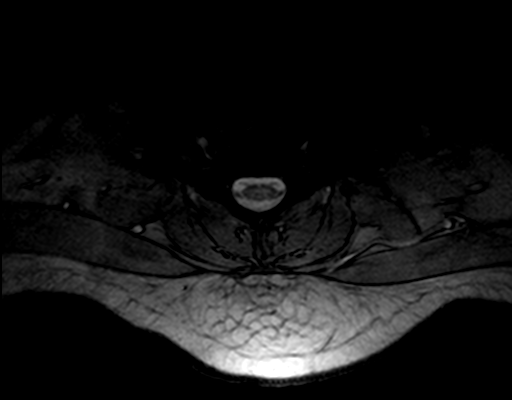
[im 7/24]
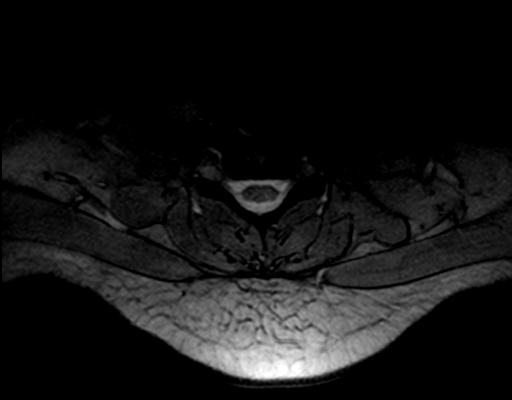
[im 11/24]
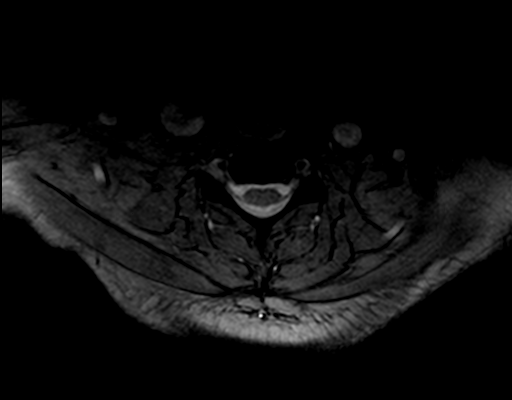
[im 13/24]
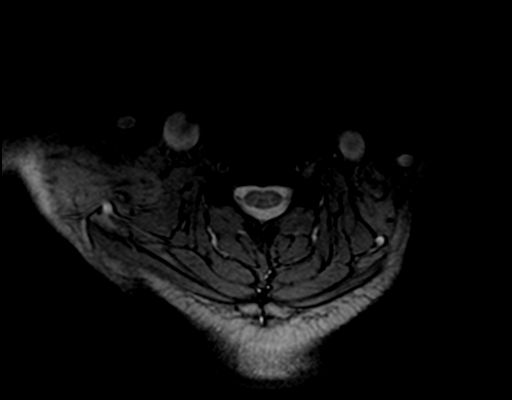
[im 17/24]
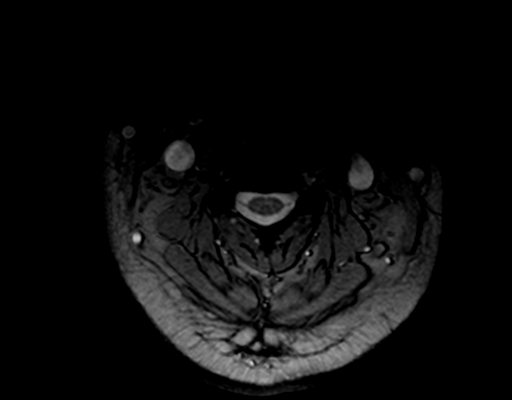
[im 19/24]
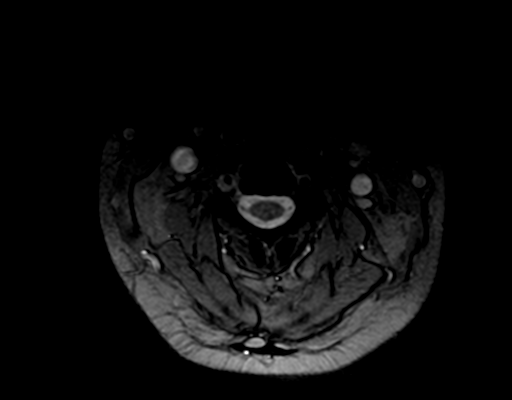
[im 21/24]
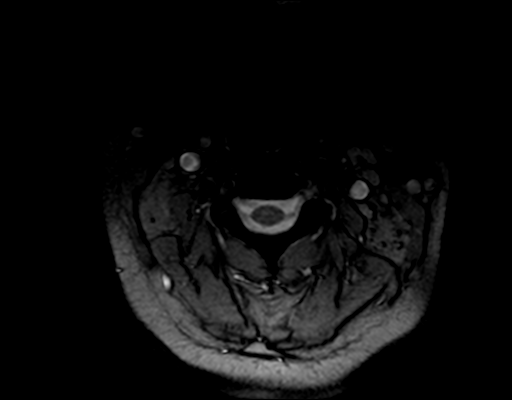

[Series 6: T2 · axial · 3.0mm · 0.39mm/px · z∈[-31,+27]mm · 3 of 24 slices shown (3 of 3)]
[im 5/24]
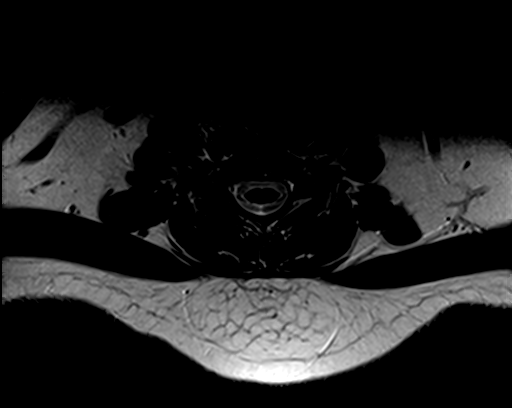
[im 13/24]
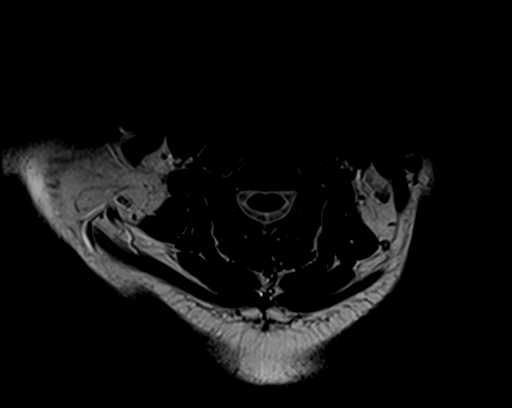
[im 21/24]
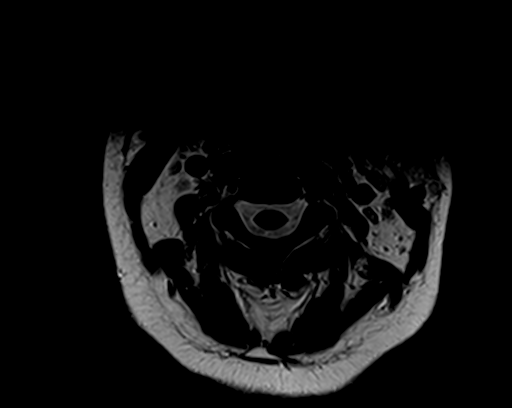

[22 of 48 positions shown; findings below may reference images not displayed]

FINDINGS: Alignment: Straightening of the normal cervical lordosis. Sagittal
alignment is maintained.

Vertebrae: No fracture, evidence of discitis, or bone lesion.

Cord: Normal signal and morphology.

Posterior Fossa, vertebral arteries, paraspinal tissues: Negative.

Disc levels:

Disc desiccation at C2-C3 and C3-C4. No significant disc bulge or
herniation at any level. No spinal canal or neuroforaminal stenosis.
IMPRESSION: 1. Very mild degenerative disc disease at C2-C3 and C3-C4 without
significant bulge or herniation. No stenosis or impingement.
